# Patient Record
Sex: Male | Born: 1970
Health system: Southern US, Community
[De-identification: ages and names within clinical notes are randomized; demographics above are authoritative.]

## PROBLEM LIST (undated history)

## (undated) DIAGNOSIS — I10 Essential (primary) hypertension: Secondary | ICD-10-CM

## (undated) HISTORY — DX: Essential (primary) hypertension: I10

---

## 2006-12-21 ENCOUNTER — Ambulatory Visit: Payer: Self-pay | Admitting: Family Medicine

## 2007-07-12 ENCOUNTER — Ambulatory Visit: Payer: Self-pay | Admitting: Family Medicine

## 2007-07-12 DIAGNOSIS — I10 Essential (primary) hypertension: Secondary | ICD-10-CM | POA: Insufficient documentation

## 2008-03-27 ENCOUNTER — Ambulatory Visit: Payer: Self-pay | Admitting: Family Medicine

## 2008-11-24 ENCOUNTER — Telehealth: Payer: Self-pay | Admitting: Family Medicine

## 2008-12-12 ENCOUNTER — Ambulatory Visit: Payer: Self-pay | Admitting: Family Medicine

## 2009-01-13 ENCOUNTER — Encounter: Payer: Self-pay | Admitting: Family Medicine

## 2009-01-13 LAB — CONVERTED CEMR LAB
ALT: 15 units/L (ref 0–53)
CO2: 21 meq/L (ref 19–32)
Calcium: 9.4 mg/dL (ref 8.4–10.5)
Chloride: 108 meq/L (ref 96–112)
Cholesterol: 177 mg/dL (ref 0–200)
Potassium: 4.3 meq/L (ref 3.5–5.3)
Sodium: 143 meq/L (ref 135–145)
Total Protein: 7.2 g/dL (ref 6.0–8.3)
VLDL: 26 mg/dL (ref 0–40)

## 2009-02-06 ENCOUNTER — Ambulatory Visit: Payer: Self-pay | Admitting: Family Medicine

## 2009-06-11 ENCOUNTER — Ambulatory Visit: Payer: Self-pay | Admitting: Family Medicine

## 2009-12-11 ENCOUNTER — Ambulatory Visit: Payer: Self-pay | Admitting: Family Medicine

## 2010-05-27 ENCOUNTER — Ambulatory Visit: Payer: Self-pay | Admitting: Family Medicine

## 2010-07-27 ENCOUNTER — Ambulatory Visit: Payer: Self-pay | Admitting: Family Medicine

## 2010-07-27 DIAGNOSIS — E669 Obesity, unspecified: Secondary | ICD-10-CM

## 2010-09-20 ENCOUNTER — Ambulatory Visit: Payer: Self-pay | Admitting: Family Medicine

## 2010-09-22 ENCOUNTER — Telehealth: Payer: Self-pay | Admitting: Family Medicine

## 2010-10-26 ENCOUNTER — Ambulatory Visit: Payer: Self-pay | Admitting: Family Medicine

## 2010-12-14 NOTE — Letter (Signed)
Summary: Out of Work  [facility]  [address], [address]   [place], [place] [address]   Phone: [phone]  Fax: [phone]    September 20, 2010   Employee:  [employer] [facility]    To Whom It May Concern:   For Medical reasons, please excuse the above named employee from work for the following dates:  Start:   Nov 3rd, 4th, 7th  End:   Nov 8th  If you need additional information, please feel free to contact our office.         Sincerely,    Seymour Bars DO

## 2010-12-14 NOTE — Assessment & Plan Note (Signed)
Summary: poison ivy/ cellutlis   Vital Signs:  Patient profile:   40 year old male Height:      66.25 inches Weight:      198 pounds BMI:     31.83 O2 Sat:      100 % on Room air Pulse rate:   86 / minute BP sitting:   128 / 81  (left arm) Cuff size:   large  Vitals Entered By: Payton Spark CMA (May 27, 2010 11:33 AM)  O2 Flow:  Room air CC: Poison ivy x 5 days.    Primary Care Provider:  Seymour Bars DO  CC:  Poison ivy x 5 days. Marland Kitchen  History of Present Illness: 40 yo WM presents for poison ivy that started 5 days ago.  He has had pruritis and has been taking Benadryl..  2 days ago, he noticed an oozing from his RLE with increased redness, tenderness and swelling.  Denies any fevers or chills.  Rash has involved his abdomen, arms and legs only.    Current Medications (verified): 1)  Lisinopril 20 Mg Tabs (Lisinopril) .Marland Kitchen.. 1 Tab By Mouth Daily  Allergies (verified): No Known Drug Allergies  Past History:  Past Medical History: Reviewed history from 12/12/2008 and no changes required. HTN  Social History: Reviewed history from 12/11/2009 and no changes required. Works for [employer].    Married to Maralyn Sago and has 3 yo son. Quit smoking 10-09 after 36 pack year history.   Occas. ETOH.  Lifting weights regularly  Review of Systems      See HPI  Physical Exam  General:  alert, well-developed, well-nourished, and well-hydrated.   Head:  normocephalic, atraumatic, and male-pattern balding.   Eyes:  conjunctiva clear Mouth:  pharynx pink and moist.   Neck:  no masses.   Lungs:  Normal respiratory effort, chest expands symmetrically. Lungs are clear to auscultation, no crackles or wheezes. Heart:  Normal rate and regular rhythm. S1 and S2 normal without gallop, murmur, click, rub or other extra sounds. Extremities:  1-2+ pitting LE edema bilat Neurologic:  gait normal.   Skin:  linear erythematous rash on the arms and legs with confluent redness, heat, edema and  crusting yellow areas on the RLE.   Cervical Nodes:  No lymphadenopathy noted Psych:  good eye contact, not anxious appearing, and not depressed appearing.     Impression & Recommendations:  Problem # 1:  CELLULITIS AND ABSCESS OF LEG EXCEPT FOOT (ICD-682.6) Leg cellullitis, secondary to scratching from initial contact dermatitis 5 days ago.  Will treat with Augmentin 2  a day for 7 days.  Wash with soap and water daily, elevate legs for swelling.  Call if redness/ swelling/ pain not improving in 72 hrs or if fever starts in the next 2-3 days. His updated medication list for this problem includes:    Amoxicillin-pot Clavulanate 875-125 Mg Tabs (Amoxicillin-pot clavulanate) .Marland Kitchen... 1 tab by mouth two times a day with food x 7 days  Problem # 2:  RHUS DERMATITIS (ICD-692.6) Fairly widespread contact dermatitis x 5 days with secondary infection.  Treat with Prednisone x 6 days.  Lukewarm oatmeal baths and calamine lotion can also be used. His updated medication list for this problem includes:    Prednisone (pak) 10 Mg Tabs (Prednisone) .Marland Kitchen... Take x 6 days as directed  Complete Medication List: 1)  Lisinopril 20 Mg Tabs (Lisinopril) .Marland Kitchen.. 1 tab by mouth daily 2)  Amoxicillin-pot Clavulanate 875-125 Mg Tabs (Amoxicillin-pot clavulanate) .Marland KitchenMarland KitchenMarland Kitchen 1  tab by mouth two times a day with food x 7 days 3)  Prednisone (pak) 10 Mg Tabs (Prednisone) .... Take x 6 days as directed  Patient Instructions: 1)  For contact dermatitis with secondary Cellulitis: 2)  Treat with Prednisone Dose Pack x 6 days +  3)  Augmentin 2 x a day with food x 7 days. 4)  Oatmeal baths with cooler water, calamine cream, rest, leg elevation. 5)  Call if not seeing improvements by early next wk. Prescriptions: PREDNISONE (PAK) 10 MG TABS (PREDNISONE) Take x 6 days as directed  #1 pack x 0   Entered and Authorized by:   Seymour Bars DO   Signed by:   Seymour Bars DO on 05/27/2010   Method used:   Electronically to        [facility] [phone]* (retail)       [address] [place], [place]  [address]       Ph: [phone]       Fax: [phone]   RxID:   [id number] AMOXICILLIN-POT CLAVULANATE 875-125 MG TABS (AMOXICILLIN-POT CLAVULANATE) 1 tab by mouth two times a day with food x 7 days  #14 x 0   Entered and Authorized by:   Seymour Bars DO   Signed by:   Seymour Bars DO on 05/27/2010   Method used:   Electronically to        [facility] [phone]* (retail)       [address] [place], [place]  [address]       Ph: [phone]       Fax: [phone]   RxID:   [id number]

## 2010-12-14 NOTE — Letter (Signed)
Summary: Out of Work  [facility]  [address]   [place], [place] [address]   Phone: [phone]  Fax: [phone]    May 27, 2010   Employee:  SHREY [employer] [facility]    To Whom It May Concern:   For Medical reasons, please excuse the above named employee from work for the following dates:  Start:   July 14th - 15th  End:   July 16th  If you need additional information, please feel free to contact our office.         Sincerely,    Seymour Bars DO

## 2010-12-14 NOTE — Assessment & Plan Note (Signed)
Summary: f/u BP   Vital Signs:  Patient profile:   40 year old male Height:      66.25 inches Weight:      215 pounds BMI:     34.56 O2 Sat:      97 % on Room air Pulse rate:   87 / minute BP sitting:   142 / 79  (left arm) Cuff size:   large  Vitals Entered By: Payton Spark CMA (July 27, 2010 3:33 PM)  O2 Flow:  Room air CC: F/U HTN    Primary Care Provider:  Seymour Bars DO  CC:  F/U HTN .  History of Present Illness: Russell Orr is a 40 year-old with a history HTN.  Today he states that is doing well.  He does report difficulty with his diet and that he has been eating poorly.  He consumes yougurt for breakfast on most days.  For lunch, he says he will eat "whatever" which ranges from salads at [facility] to burgers at [employer] [facility] to sandwiches from truck stops.  For dinner he typically eats out or has frozen dinners.  He reports that he has gained back most of the weight he has lost over the past year and that he feels heavier.  He claims that his family is too busy to cook and prepare meals at home.  He also reports that he is not exercising at all at home but does get a lot activity from his job.  He reports recent stressors of not being happy with his current line of employment and that is causing a lot of stress for him.    He denies any chest pain, palpitations, shortness of breath, chnages in his vision or leg edema  Current Medications (verified): 1)  Lisinopril 20 Mg Tabs (Lisinopril) .Marland Kitchen.. 1 Tab By Mouth Daily  Allergies (verified): No Known Drug Allergies  Past History:  Past Medical History: Reviewed history from 12/12/2008 and no changes required. HTN  Social History: Works for [employer].    Married to Maralyn Sago and has 2 young kids.   Quit smoking 10-09 after 36 pack year history.   Occas. ETOH.  Lifting weights regularly  Review of Systems      See HPI  Physical Exam  General:  alert and well-developed.  overwt Eyes:  PEERLA, sclera are white  and conjunctivae are clear.  Normal cup to disk ratio. Mouth:  pharynx pink and moist.   Neck:  supple and no thyromegaly.   Lungs:  Clear to auscultation bilaterally with no crackles, wheezes, rales or rhonchi Heart:  normal rate, regular rhythm, no murmur, no gallop, and no rub.   Pulses:  Pulses are 2+ bilaterally.  No carotid bruits. Extremities:  No clubbing, cyanosis, edema. Skin:  color normal.     Impression & Recommendations:  Problem # 1:  HYPERTENSION, BENIGN ESSENTIAL (ICD-401.1) BP is up likely due to wt gain.  Will change Lisinopril to Lisinopril - HCTZ daily.  Update fasting labs.  RTC in 3 mos.   His updated medication list for this problem includes:    Lisinopril-hydrochlorothiazide 20-12.5 Mg Tabs (Lisinopril-hydrochlorothiazide) .Marland Kitchen... 1 tab by mouth qam  Orders: T-Comprehensive Metabolic Panel ([id number])  BP today: 142/79 Prior BP: 128/81 (05/27/2010)  Prior 10 Yr Risk Heart Disease: Not enough information (07/12/2007)  Labs Reviewed: K+: 4.3 (01/13/2009) Creat: : 1.00 (01/13/2009)   Chol: 177 (01/13/2009)   HDL: 35 (01/13/2009)   LDL: 116 (01/13/2009)   TG: 130 (01/13/2009)  Problem #  2:  OBESITY, UNSPECIFIED (ICD-278.00) Assessment: New BMI 34 c/w class I obesity.  Has gained 17 lbs from last visit due to poor eating habits and lack of exercise. Discussed important lifestyle changes. Pt is interested in getting back on track. Will RTC in 3 mos for f/u.  If wt comes down, should be able to cut back on BP meds.  Complete Medication List: 1)  Lisinopril-hydrochlorothiazide 20-12.5 Mg Tabs (Lisinopril-hydrochlorothiazide) .Marland Kitchen.. 1 tab by mouth qam  Other Orders: T-Lipid Profile ([id number])  Patient Instructions: 1)  Change Lisinopril to Lisinopril/ HCTZ daily for HTN. 2)  Work on [employer] and regular exercise. 3)  Call if any problems. 4)  Update fasting labs.  5)  will call you w/ results. 6)  Return to f/u BP / WT in 3  mos. Prescriptions: LISINOPRIL-HYDROCHLOROTHIAZIDE 20-12.5 MG TABS (LISINOPRIL-HYDROCHLOROTHIAZIDE) 1 tab by mouth qAM  #30 x 5   Entered and Authorized by:   Russell Bars DO   Signed by:   Russell Bars DO on 07/27/2010   Method used:   Electronically to        [facility] [phone]* (retail)       [address] [place], [place]  [address]       Ph: [phone]       Fax: [phone]   RxID:   [id number]

## 2010-12-14 NOTE — Assessment & Plan Note (Signed)
Summary: URI   Vital Signs:  Patient profile:   40 year old male Height:      66.25 inches Weight:      217 pounds BMI:     34.89 O2 Sat:      99 % on Room air Temp:     98.4 degrees F oral Pulse rate:   73 / minute BP sitting:   131 / 85  (left arm) Cuff size:   large  Vitals Entered By: Payton Spark CMA (September 20, 2010 9:47 AM)  O2 Flow:  Room air CC: ST, HA and congestion x 6 days.    Primary Care Provider:  Seymour Bars DO  CC:  ST and HA and congestion x 6 days. Marland Kitchen  History of Present Illness: 40 yo WM presents for sore throat x 5-6 days, HA, head and chest congestion, weakness.  He has fatigue but no bodyaches.  No fevers or chills.  No rash.  No anorexia, N/V/D.  He is taking Nyquil and Dayquil.  His cough is mostly dry.  He has some chest tightness.  No SOB.  He has yellow sputum production.  His kids have colds.    Current Medications (verified): 1)  Lisinopril-Hydrochlorothiazide 20-12.5 Mg Tabs (Lisinopril-Hydrochlorothiazide) .Marland Kitchen.. 1 Tab By Mouth Qam  Allergies (verified): No Known Drug Allergies  Past History:  Past Medical History: Reviewed history from 12/12/2008 and no changes required. HTN  Social History: Reviewed history from 07/27/2010 and no changes required. Works for [employer].    Married to Maralyn Sago and has 2 young kids.   Quit smoking 10-09 after 36 pack year history.   Occas. ETOH.  Lifting weights regularly  Review of Systems      See HPI  Physical Exam  General:  alert, well-developed, well-nourished, and well-hydrated.   Head:  normocephalic, atraumatic, and male-pattern balding.  sinuses NTTP Eyes:  conjunctiva clear Ears:  EACs patent; TMs translucent and gray with good cone of light and bony landmarks.  Nose:  thick yellow rhinorrhea with boggy turbinates Mouth:  o/p injected with 1+ tonsilar hypertrophy. no exudates or vesicles Neck:  no masses.   Lungs:  Normal respiratory effort, chest expands symmetrically. Lungs are  clear to auscultation, no crackles or wheezes. Heart:  Normal rate and regular rhythm. S1 and S2 normal without gallop, murmur, click, rub or other extra sounds. Skin:  color normal and no rashes.   Cervical Nodes:  shotty L>R submandibular LA   Impression & Recommendations:  Problem # 1:  VIRAL URI (ICD-465.9)  Day 5-6 of Viral URI with neg rapid strep. Supportive care with Duke's MMW, clear fluids, Advil Cold and Sinus. Call if not starting to improve by end of the week.  Orders: Rapid Strep ([address])  Complete Medication List: 1)  Lisinopril-hydrochlorothiazide 20-12.5 Mg Tabs (Lisinopril-hydrochlorothiazide) .Marland Kitchen.. 1 tab by mouth qam 2)  Dukes Magic Mouthwash  .Marland Kitchen.. 10 ml swish and gargle 4 x a day as needed for sore throat pain  Patient Instructions: 1)  Rapid Strep Negative. 2)  Will treat Viral upper respiratory infection with Magic Mouthwash, Advil Cold and Sinus, rest, clear fluids. 3)  Call if not starting to feel better by Thursday afternoon. Prescriptions: DUKES MAGIC MOUTHWASH 10 ml swish and gargle 4 x a day as needed for sore throat pain  #150 ml x 0   Entered and Authorized by:   Seymour Bars DO   Signed by:   Seymour Bars DO on 09/20/2010   Method  used:   Printed then faxed to ...       [facility] [phone]* (retail)       [address] [place], [place]  [address]       Ph: [phone]       Fax: [phone]   RxID:   [id number]    Orders Added: 1)  Rapid Strep [[address]] 2)  Est. Patient Level III [[address]]

## 2010-12-14 NOTE — Progress Notes (Signed)
Summary: Not feeling any better  Phone Note Call from Patient Call back at Home Phone [phone]   Caller: Patient Call For: Seymour Bars DO Summary of Call: Pt calls and states not feeling any better than what he did on Monday and you told him to call back if not and you would call in med for him. Uses [facility] [address] [facility] [place] Initial call taken by: Kathlene November LPN,  September 22, 2010 3:58 PM    New/Updated Medications: AMOXICILLIN 500 MG CAPS (AMOXICILLIN) 1 capsule by mouth three times a day x 7 days Prescriptions: AMOXICILLIN 500 MG CAPS (AMOXICILLIN) 1 capsule by mouth three times a day x 7 days  #21 x 0   Entered and Authorized by:   Seymour Bars DO   Signed by:   Seymour Bars DO on 09/22/2010   Method used:   Electronically to        [facility] [phone]* (retail)       [address] [place], [place]  [address]       Ph: [phone]       Fax: [phone]   RxID:   [id number]

## 2010-12-14 NOTE — Assessment & Plan Note (Signed)
Summary: f/u HTN   Vital Signs:  Patient profile:   40 year old male Height:      66.25 inches Weight:      180 pounds BMI:     28.94 O2 Sat:      100 % on Room air Temp:     98.3 degrees F oral Pulse rate:   91 / minute BP sitting:   140 / 79  (left arm) Cuff size:   large  Vitals Entered By: Payton Spark CMA (December 11, 2009 3:43 PM)  O2 Flow:  Room air CC: F/U BP   Primary Care Provider:  Seymour Bars DO  CC:  F/U BP.  History of Present Illness: 40 yo WM presents for f/u HTN.  He just started exercising again.  He ate [place] food last night.  He has had a HA.  No CP or DOE.  No leg swelling.  No problems with the Lisinopril.  His labs are UTD.    Allergies: No Known Drug Allergies  Past History:  Past Medical History: Reviewed history from 12/12/2008 and no changes required. HTN  Social History: Works for [employer].    Married to Maralyn Sago and has 52 yo son. Quit smoking 10-09 after 36 pack year history.   Occas. ETOH.  Lifting weights regularly  Review of Systems      See HPI  Physical Exam  General:  alert, well-developed, well-nourished, and well-hydrated.   Head:  normocephalic and atraumatic.   Eyes:  pupils equal, pupils round, and pupils reactive to light.   Mouth:  pharynx pink and moist.   Neck:  no masses.   Lungs:  Normal respiratory effort, chest expands symmetrically. Lungs are clear to auscultation, no crackles or wheezes. Heart:  Normal rate and regular rhythm. S1 and S2 normal without gallop, murmur, click, rub or other extra sounds. Extremities:  no E/C/C Skin:  color normal.   Psych:  good eye contact, not anxious appearing, and not depressed appearing.     Impression & Recommendations:  Problem # 1:  HYPERTENSION, BENIGN ESSENTIAL (ICD-401.1) BP higher.  I will increase his Lisinopril from 10 mg to 20 mg once daily. He is working on diet, exercise and wt loss.  Labs due in 2 mos. His updated medication list for this problem  includes:    Lisinopril 20 Mg Tabs (Lisinopril) .Marland Kitchen... 1 tab by mouth daily  BP today: 140/79 Prior BP: 130/77 (06/11/2009)  Prior 10 Yr Risk Heart Disease: Not enough information (07/12/2007)  Labs Reviewed: K+: 4.3 (01/13/2009) Creat: : 1.00 (01/13/2009)   Chol: 177 (01/13/2009)   HDL: 35 (01/13/2009)   LDL: 116 (01/13/2009)   TG: 130 (01/13/2009)  Complete Medication List: 1)  Lisinopril 20 Mg Tabs (Lisinopril) .Marland Kitchen.. 1 tab by mouth daily Prescriptions: LISINOPRIL 20 MG TABS (LISINOPRIL) 1 tab by mouth daily  #90 x 0   Entered and Authorized by:   Seymour Bars DO   Signed by:   Seymour Bars DO on 12/11/2009   Method used:   Printed then faxed to ...       [facility] [phone]* (retail)       [address] [place], [place]  [address]       Ph: [phone]       Fax: [phone]   RxID:   [id number]

## 2011-02-14 ENCOUNTER — Telehealth: Payer: Self-pay | Admitting: *Deleted

## 2011-02-14 NOTE — Telephone Encounter (Signed)
Wife called stating Pt has poison ivy again and is requesting Rx be sent in since you do not have any apts available this week. Please advise.

## 2011-02-14 NOTE — Telephone Encounter (Signed)
Pls find out where his rash is.  If small, can use OTC Hydrocortisone cream 3 x a day and oral Benadryl for itching.  Let me know.

## 2011-02-14 NOTE — Telephone Encounter (Signed)
LMOM informing wife of the above and for her to [facility]

## 2011-05-20 ENCOUNTER — Other Ambulatory Visit: Payer: Self-pay | Admitting: Family Medicine

## 2011-05-20 MED ORDER — LISINOPRIL-HYDROCHLOROTHIAZIDE 20-12.5 MG PO TABS: 1.0000 | ORAL_TABLET | Freq: Every day | ORAL | Status: DC

## 2011-07-21 ENCOUNTER — Encounter: Payer: Self-pay | Admitting: Family Medicine

## 2011-07-21 ENCOUNTER — Inpatient Hospital Stay (INDEPENDENT_AMBULATORY_CARE_PROVIDER_SITE_OTHER)
Admission: RE | Admit: 2011-07-21 | Discharge: 2011-07-21 | Disposition: A | Discharge status: Home / Self Care | Payer: BC Managed Care – PPO | Source: Ambulatory Visit | Attending: Family Medicine | Admitting: Family Medicine

## 2011-07-21 DIAGNOSIS — J029 Acute pharyngitis, unspecified: Secondary | ICD-10-CM

## 2011-07-21 DIAGNOSIS — J069 Acute upper respiratory infection, unspecified: Secondary | ICD-10-CM

## 2011-09-05 ENCOUNTER — Other Ambulatory Visit: Payer: Self-pay | Admitting: Family Medicine

## 2011-09-05 NOTE — Telephone Encounter (Signed)
Pt's wife called for refill of her husband med (lisinopril).   Plan:  Pt overdue for his appt.  Appt scheduled for 09-20-11 with Dr. Thurmond Butts.  Wife aware. Jarvis Newcomer, LPN Domingo Dimes

## 2011-09-15 ENCOUNTER — Encounter: Payer: Self-pay | Admitting: Family Medicine

## 2011-09-20 ENCOUNTER — Ambulatory Visit (INDEPENDENT_AMBULATORY_CARE_PROVIDER_SITE_OTHER): Payer: BC Managed Care – PPO | Admitting: Family Medicine

## 2011-09-20 ENCOUNTER — Encounter: Payer: Self-pay | Admitting: Family Medicine

## 2011-09-20 VITALS — BP 132/89 | HR 87 | Ht 67.0 in | Wt 239.0 lb

## 2011-09-20 DIAGNOSIS — Z23 Encounter for immunization: Secondary | ICD-10-CM

## 2011-09-20 DIAGNOSIS — I10 Essential (primary) hypertension: Secondary | ICD-10-CM

## 2011-09-20 MED ORDER — LISINOPRIL-HYDROCHLOROTHIAZIDE 20-25 MG PO TABS: 1.0000 | ORAL_TABLET | Freq: Every day | ORAL | Status: DC

## 2011-09-20 NOTE — Progress Notes (Signed)
  Subjective:    Patient ID: Russell Orr, male    DOB: 03-27-1971, 40 y.o.   MRN: [id number]  Hypertension This is a chronic problem. The current episode started more than 1 year ago. The problem has been gradually improving since onset. The problem is controlled. Associated symptoms include shortness of breath. Pertinent negatives include no anxiety, chest pain, malaise/fatigue or palpitations. Risk factors for coronary artery disease include male gender, obesity, sedentary lifestyle, family history and diabetes mellitus. Past treatments include angiotensin blockers and diuretics. Compliance problems include exercise and diet.  There is no history of angina, kidney disease, heart failure or renovascular disease. There is no history of chronic renal disease.      Review of Systems  Constitutional: Negative for malaise/fatigue.  Respiratory: Positive for shortness of breath.   Cardiovascular: Negative for chest pain and palpitations.  All other systems reviewed and are negative.   BP 132/89  Pulse 87  Ht 5\' 7"  (1.702 m)  Wt 239 lb (108.41 kg)  BMI 37.43 kg/m2  SpO2 96%     Objective:   Physical Exam  Vitals reviewed. Constitutional: He is oriented to person, place, and time. He appears well-developed and well-nourished.       See also form, to be scanned into chart.  HENT:  Head: Normocephalic.  Neck: Normal range of motion. Neck supple.  Cardiovascular: Normal rate, regular rhythm and normal heart sounds.  Exam reveals no gallop and no friction rub.   No murmur heard. Pulmonary/Chest: Effort normal. No respiratory distress. He has no wheezes.  Genitourinary: Testes normal.  Musculoskeletal:             Neurological: He is alert and oriented to person, place, and time.  Skin: Skin is warm and dry.       wnl  Psychiatric: He has a normal mood and affect.          Assessment & Plan:  Discussed about need to exercise. Expressed concern about weight gain Return  for PE in 3-4 months

## 2011-09-20 NOTE — Patient Instructions (Signed)

## 2011-10-17 NOTE — Progress Notes (Signed)
Summary: sore throat/sinus/coughing rm 2   Vital Signs:  Patient Profile:   40 Years Old Male CC:      sore throat, sinus problems x 6 days Height:     66.25 inches Weight:      227.50 pounds O2 Sat:      97 % O2 treatment:    Room Air Temp:     99.4 degrees F oral Pulse rate:   83 / minute Resp:     18 per minute BP sitting:   121 / 80  (left arm) Cuff size:   regular  Vitals Entered By: Clemens Catholic LPN (July 21, 2011 9:57 AM)                  Updated Prior Medication List: LISINOPRIL-HYDROCHLOROTHIAZIDE 20-12.5 MG TABS (LISINOPRIL-HYDROCHLOROTHIAZIDE) 1 tab by mouth qAM  Current Allergies (reviewed today): No known allergies History of Present Illness Chief Complaint: sore throat, sinus problems x 6 days History of Present Illness:  Subjective: Patient complains of URI symptoms that started 6 days ago with a sore throat, now persistent. + productive cough for four days No pleuritic pain No wheezing + nasal congestion + post-nasal drainage ? sinus pain/pressure + drainage from right eye today + bilateral earache No hemoptysis No SOB No fever/chills No nausea No vomiting No abdominal pain No diarrhea No skin rashes + fatigue No myalgias No headache Used OTC meds without relief   REVIEW OF SYSTEMS Constitutional Symptoms       Complains of fatigue.     Denies fever, chills, night sweats, weight loss, and weight gain.  Eyes       Complains of eye drainage.      Denies change in vision, eye pain, glasses, contact lenses, and eye surgery. Ear/Nose/Throat/Mouth       Complains of ear pain, sinus problems, sore throat, and hoarseness.      Denies hearing loss/aids, change in hearing, ear discharge, dizziness, frequent runny nose, frequent nose bleeds, and tooth pain or bleeding.  Respiratory       Complains of productive cough.      Denies dry cough, wheezing, shortness of breath, asthma, bronchitis, and emphysema/COPD.  Cardiovascular  Complains of tires easily with exhertion.      Denies murmurs and chest pain.    Gastrointestinal       Denies stomach pain, nausea/vomiting, diarrhea, constipation, blood in bowel movements, and indigestion. Genitourniary       Denies painful urination, kidney stones, and loss of urinary control. Neurological       Complains of weakness.      Denies headaches, loss of or changes in sensation, numbness, tngling, tremors, paralysis, seizures, and fainting/blackouts. Musculoskeletal       Denies muscle pain, joint pain, joint stiffness, decreased range of motion, redness, swelling, muscle weakness, and gout.  Skin       Denies bruising, unusual mles/lumps or sores, and hair/skin or nail changes.  Psych       Denies mood changes, temper/anger issues, anxiety/stress, speech problems, depression, and sleep problems. Other Comments: pt c/o sore throat, productive cough, sinus drainage and  bil ear ache off and on x 6 days. he has taken dayquil.   Past History:  Past Medical History: Reviewed history from 12/12/2008 and no changes required. HTN  Past Surgical History: Reviewed history from 06/11/2009 and no changes required. none  Family History: Reviewed history from 12/21/2006 and no changes required. father AMI at 5, died.  Had HTN  and high chol mother died at 69 with ESRD, bipolar d/o brother obese  Social History: Reviewed history from 07/27/2010 and no changes required. Works for [employer].    Married to Maralyn Sago and has 2 young kids.   Quit smoking 10-09 after 36 pack year history.   Occas. ETOH.  Lifting weights regularly   Objective:  No acute distress  Eyes:  Pupils are equal, round, and reactive to light and accomodation.  Extraocular movement is intact.  Left conjuctivae normal; right conjuctivae slightly pink Ears:  Canals normal.   Tight tympanic membrane has serous effsion and slightly pink.  Left tympanic membrane normal Nose:  Congested turbinates worse on  right.  + right maxillary sinus tenderness   Pharynx:  Normal  Neck:  Supple.  No adenopathy is present.  No thyromegaly is present  Lungs:  Clear to auscultation.  Breath sounds are equal.  Heart:  Regular rate and rhythm without murmurs, rubs, or gallops.  Abdomen:  Nontender without masses or hepatosplenomegaly.  Bowel sounds are present.  No CVA or flank tenderness.  Rapid strep test negative   Assessment New Problems: ACUTE PHARYNGITIS (ICD-462) ACUTE MAXILLARY SINUSITIS (ICD-461.0) UPPER RESPIRATORY INFECTION (ICD-465.9)  VIRAL URI WITH RIGHT SINUSITIS AND RIGHT SEROUS OTITIS  Plan New Medications/Changes: BENZONATATE 200 MG CAPS (BENZONATATE) One by mouth hs as needed cough  #12 x 0, 07/21/2011, Donna Christen MD AMOXICILLIN 875 MG TABS (AMOXICILLIN) One by mouth two times a day  #20 x 0, 07/21/2011, Donna Christen MD  New Orders: Pulse Oximetry (single measurment) [94760] New Patient Level III [99203] Rapid Strep [45409] Planning Comments:   Begin amoxicillin, expectorant, topical decongestant, cough suppressant at bedtime.  Increase fluid intake Ibuprofen for sore throat Followup with PCP if not improving 7 to 10 days   The patient and/or caregiver has been counseled thoroughly with regard to medications prescribed including dosage, schedule, interactions, rationale for use, and possible side effects and they verbalize understanding.  Diagnoses and expected course of recovery discussed and will return if not improved as expected or if the condition worsens. Patient and/or caregiver verbalized understanding.  Prescriptions: BENZONATATE 200 MG CAPS (BENZONATATE) One by mouth hs as needed cough  #12 x 0   Entered and Authorized by:   Donna Christen MD   Signed by:   Donna Christen MD on 07/21/2011   Method used:   Print then Give to Patient   RxID:   [id number] AMOXICILLIN 875 MG TABS (AMOXICILLIN) One by mouth two times a day  #20 x 0   Entered and Authorized by:    Donna Christen MD   Signed by:   Donna Christen MD on 07/21/2011   Method used:   Print then Give to Patient   RxID:   [id number]   Patient Instructions: 1)  Take Mucinex  (guaifenesin) twice daily for congestion. 2)  Increase fluid intake, rest. 3)  May use Afrin nasal spray (or generic oxymetazoline) twice daily for about 5 days.  Also recommend using saline nasal spray several times daily and/or saline nasal irrigation 4)  May take Ibuprofen 200mg , 4 tabs every 8 hours with food for sore throat. 5)  Followup with family doctor if not improving 7 to 10 days.   Orders Added: 1)  Pulse Oximetry (single measurment) [94760] 2)  New Patient Level III [32440] 3)  Rapid Strep [10272]    Laboratory Results  Date/Time Received: July 21, 2011 10:00 AM  Date/Time Reported: July 21, 2011  10:00 AM   Other Tests  Rapid Strep: negative  Kit Test Internal QC: Negative   (Normal Range: Negative)

## 2011-12-20 ENCOUNTER — Encounter: Payer: BC Managed Care – PPO | Admitting: Family Medicine

## 2012-01-12 ENCOUNTER — Encounter: Payer: Self-pay | Admitting: *Deleted

## 2012-01-17 ENCOUNTER — Encounter: Payer: BC Managed Care – PPO | Admitting: Family Medicine

## 2012-11-22 ENCOUNTER — Ambulatory Visit (INDEPENDENT_AMBULATORY_CARE_PROVIDER_SITE_OTHER): Payer: BC Managed Care – PPO | Admitting: Family Medicine

## 2012-11-22 ENCOUNTER — Encounter: Payer: Self-pay | Admitting: Family Medicine

## 2012-11-22 VITALS — BP 127/80 | HR 65 | Ht 67.0 in | Wt 199.0 lb

## 2012-11-22 DIAGNOSIS — I10 Essential (primary) hypertension: Secondary | ICD-10-CM

## 2012-11-22 DIAGNOSIS — Z0000 Encounter for general adult medical examination without abnormal findings: Secondary | ICD-10-CM

## 2012-11-22 DIAGNOSIS — E786 Lipoprotein deficiency: Secondary | ICD-10-CM

## 2012-11-22 DIAGNOSIS — E669 Obesity, unspecified: Secondary | ICD-10-CM

## 2012-11-22 LAB — LIPID PANEL
Cholesterol: 211 mg/dL — ABNORMAL HIGH (ref 0–200)
Total CHOL/HDL Ratio: 4.5 Ratio
Triglycerides: 79 mg/dL (ref <150)
VLDL: 16 mg/dL (ref 0–40)

## 2012-11-22 LAB — BASIC METABOLIC PANEL WITH GFR
CO2: 28 mEq/L (ref 19–32)
Calcium: 10.5 mg/dL (ref 8.4–10.5)
Creat: 0.92 mg/dL (ref 0.50–1.35)
GFR, Est African American: >89 mL/min
Glucose, Bld: 91 mg/dL (ref 70–99)

## 2012-11-22 MED ORDER — LISINOPRIL-HYDROCHLOROTHIAZIDE 20-12.5 MG PO TABS: 1.0000 | ORAL_TABLET | Freq: Every day | ORAL | Status: DC

## 2012-11-22 NOTE — Progress Notes (Signed)
CC: Russell Orr is a 42 y.o. male is here for Annual Exam and Hypertension  Colonoscopy: Not indicated Prostate: Not indicated  Influenza Vaccine: Sept 2013 Pneumovax: Not indicated Td/Tdap: April 2103 Zoster: Not indicated   Subjective: HPI:  Over the past 2 weeks have you been bothered by: - Little interest or pleasure in doing things: No - Feeling down depressed or hopeless: No  Patient tries to watch the eats, appears to have a varied diet. No formal exercise routine but stays active on his job. Social alcohol use, has quit smoking for over 3 years, no recreational drug use.   Review Of Systems Outlined In HPI  Past Medical History  Diagnosis Date  . Hypertension      Family History  Problem Relation Age of Onset  . Heart attack Father   . Hypertension Father   . Hyperlipidemia Father   . Bipolar disorder Mother     died ESRD age 66  . Obesity Brother      History  Substance Use Topics  . Smoking status: Former Smoker    Quit date: 08/14/2008  . Smokeless tobacco: Not on file  . Alcohol Use: Yes     Comment: occasional     Objective: Filed Vitals:   11/22/12 0834  BP: 127/80  Pulse: 65    General: No Acute Distress HEENT: Atraumatic, normocephalic, conjunctivae normal without scleral icterus.  No nasal discharge, hearing grossly intact, TMs with good landmarks bilaterally with no middle ear abnormalities, posterior pharynx clear without oral lesions. Neck: Supple, trachea midline, no cervical nor supraclavicular adenopathy. Pulmonary: Clear to auscultation bilaterally without wheezing, rhonchi, nor rales. Cardiac: Regular rate and rhythm.  No murmurs, rubs, nor gallops. No peripheral edema.  2+ peripheral pulses bilaterally. Abdomen: Bowel sounds normal.  No masses.  Non-tender without rebound.  Negative Murphy's sign. GU: Penis without lesions.  Bilateral descended testicles. Pedunculated skin tag just behind the right testicle on the thigh  noninflamed MSK: Grossly intact, no signs of weakness.  Full strength throughout upper and lower extremities.  Full ROM in upper and lower extremities.  No midline spinal tenderness. Neuro: Gait unremarkable, CN II-XII grossly intact.  C5-C6 Reflex 2/4 Bilaterally, L4 Reflex 2/4 Bilaterally.  Cerebellar function intact. Skin: No rashes. Psych: Alert and oriented to person/place/time.  Thought process normal. No anxiety/depression.  Assessment & Plan: Russell Orr was seen today for annual exam and hypertension.  Diagnoses and associated orders for this visit:  Annual physical exam  Hypertension, benign essential - lisinopril-hydrochlorothiazide (PRINZIDE,ZESTORETIC) 20-12.5 MG per tablet; Take 1 tablet by mouth daily. - lisinopril-hydrochlorothiazide (ZESTORETIC) 20-12.5 MG per tablet; Take 1 tablet by mouth daily. - BASIC METABOLIC PANEL WITH GFR  Obesity, unspecified  Low hdl (under 40) - Lipid panel    Discussed health maintenance topics for general health promotion and wellness, a handout was given to emphasize these topics for the patient's reference. Counseling on benign nature of skin tags. Hypertension at goal continue current regimen above. Basic metabolic panel to ensure no renal or electrolyte dysfunction applaud his continued weight loss with dietary interventions Overdue for a lipid panel  Return in about 1 year (around 11/22/2013).

## 2012-11-22 NOTE — Patient Instructions (Addendum)
Blood Pressure Goals: Less than 140/90   Dr. Genelle Bal General Advice Following Your Complete Physical Exam  The Benefits of Regular Exercise: Unless you suffer from an uncontrolled cardiovascular condition, studies strongly suggest that regular exercise and physical activity will add to both the quality and length of your life.  The World Health Organization recommends 150 minutes of moderate intensity aerobic activity every week.  This is best split over 3-4 days a week, and can be as simple as a brisk walk for just over 35 minutes "most days of the week".  This type of exercise has been shown to lower LDL-Cholesterol, lower average blood sugars, lower blood pressure, lower cardiovascular disease risk, improve memory, and increase one's overall sense of wellbeing.  The addition of anaerobic (or "strength training") exercises offers additional benefits including but not limited to increased metabolism, prevention of osteoporosis, and improved overall cholesterol levels.  How Can I Strive For A Low-Fat Diet?: Current guidelines recommend that 25-35 percent of your daily energy (food) intake should come from fats.  One might ask how can this be achieved without having to dissect each meal on a daily basis?  Switch to skim or 1% milk instead of whole milk.  Focus on lean meats such as ground [place], fresh fish, baked chicken, and lean cuts of beef as your source of dietary protein.  Consume less than 300mg /day of dietary cholesterol.  Limit trans fatty acid consumption primarily by limiting synthetic trans fats such as partially hydrogenated oils (Ex: fried fast foods).  Focus efforts on reducing your intake of "solid" fats (Ex: Butter).  Substitute olive or vegetable oil for solid fats where possible.  Moderation of Salt Intake: Provided you don't carry a diagnosis of congestive heart failure nor renal failure, I recommend a daily allowance of no more than 2300 mg of salt (sodium).  Keeping under  this daily goal is associated with a decreased risk of cardiovascular events, creeping above it can lead to elevated blood pressures and increases your risk of cardiovascular events.  Milligrams (mg) of salt is listed on all nutrition labels, and your daily intake can add up faster than you think.  Most canned and frozen dinners can pack in over half your daily salt allowance in one meal.    Lifestyle Health Risks: Certain lifestyle choices carry specific health risks.  As you may already know, tobacco use has been associated with increasing one's risk of cardiovascular disease, pulmonary disease, numerous cancers, among many other issues.  What you may not know is that there are medications and nicotine replacement strategies that can more than double your chances of successfully quitting.  I would be thrilled to help manage your quitting strategy if you currently use tobacco products.  When it comes to alcohol use, I've yet to find an "ideal" daily allowance.  Provided an individual does not have a medical condition that is exacerbated by alcohol consumption, general guidelines determine "safe drinking" as no more than two standard drinks for a man or no more than one standard drink for a male per day.  However, much debate still exists on whether any amount of alcohol consumption is technically "safe".  My general advice, keep alcohol consumption to a minimum for general health promotion.  If you or others believe that alcohol, tobacco, or recreational drug use is interfering with your life, I would be happy to provide confidential counseling regarding treatment options.  General "Over The Counter" Nutrition Advice: Postmenopausal women should aim for a daily  calcium intake of 1200 mg, however a significant portion of this might already be provided by diets including milk, yogurt, cheese, and other dairy products.  Vitamin D has been shown to help preserve bone density, prevent fatigue, and has even been  shown to help reduce falls in the elderly.  Ensuring a daily intake of 800 Units of Vitamin D is a good place to start to enjoy the above benefits, we can easily check your Vitamin D level to see if you'd potentially benefit from supplementation beyond 800 Units a day.  Folic Acid intake should be of particular concern to women of childbearing age.  Daily consumption of 400-800 mcg of Folic Acid is recommended to minimize the chance of spinal cord defects in a fetus should pregnancy occur.    For many adults, accidents still remain one of the most common culprits when it comes to cause of death.  Some of the simplest but most effective preventitive habits you can adopt include regular seatbelt use, proper helmet use, securing firearms, and regularly testing your smoke and carbon monoxide detectors.  Russell Orr. [facility] DO Med [facility] [address], [address] [place], [place] [address] Phone: [phone]

## 2012-11-23 ENCOUNTER — Encounter: Payer: Self-pay | Admitting: Family Medicine

## 2012-11-23 DIAGNOSIS — E785 Hyperlipidemia, unspecified: Secondary | ICD-10-CM | POA: Insufficient documentation

## 2012-12-29 ENCOUNTER — Other Ambulatory Visit: Payer: Self-pay

## 2013-09-19 ENCOUNTER — Other Ambulatory Visit: Payer: Self-pay

## 2013-12-27 ENCOUNTER — Other Ambulatory Visit: Payer: Self-pay | Admitting: Family Medicine

## 2014-10-22 ENCOUNTER — Other Ambulatory Visit: Payer: Self-pay

## 2014-10-22 MED ORDER — LISINOPRIL-HYDROCHLOROTHIAZIDE 20-12.5 MG PO TABS: 1.0000 | ORAL_TABLET | Freq: Every day | ORAL | Status: DC

## 2014-10-27 ENCOUNTER — Encounter: Payer: Self-pay | Admitting: Family Medicine

## 2014-10-27 ENCOUNTER — Ambulatory Visit (INDEPENDENT_AMBULATORY_CARE_PROVIDER_SITE_OTHER): Payer: BC Managed Care – PPO | Admitting: Family Medicine

## 2014-10-27 VITALS — BP 147/94 | HR 89 | Wt 232.0 lb

## 2014-10-27 DIAGNOSIS — I10 Essential (primary) hypertension: Secondary | ICD-10-CM

## 2014-10-27 DIAGNOSIS — Z789 Other specified health status: Secondary | ICD-10-CM | POA: Diagnosis not present

## 2014-10-27 DIAGNOSIS — F1090 Alcohol use, unspecified, uncomplicated: Secondary | ICD-10-CM

## 2014-10-27 MED ORDER — LISINOPRIL-HYDROCHLOROTHIAZIDE 20-12.5 MG PO TABS: 1.0000 | ORAL_TABLET | Freq: Every day | ORAL | Status: DC

## 2014-10-27 NOTE — Progress Notes (Signed)
CC: Russell Orr is a 43 y.o. male is here for Hypertension   Subjective: HPI:  Follow-up essential hypertension: Since I saw him last he's been checking his blood pressure periodically and states that it typically below 140/90. He's been taking lisinopril-HCTZ on a daily basis without missed doses.  He denies any known side effects or intolerance. Blood pressure seems to be worse when he does not watch what he eats, for instance this weekend he tells me that he ate junk food all weekend long with a higher sodium intake that he usually consumes. He also tells me that he went on a bender this weekend with alcohol use and is unable to quantify how much he was drinking but that was heavy and more than what he usually drinks. He denies any difficulty with cutting back drinking and tells me he has no troubles with alcohol cessation when he puts his mind to it. He denies drinking on a daily basis. Denies chest pain shortness of breath orthopnea or peripheral edema nor motor or sensory disturbances.   Review Of Systems Outlined In HPI  Past Medical History  Diagnosis Date  . Hypertension     No past surgical history on file. Family History  Problem Relation Age of Onset  . Heart attack Father   . Hypertension Father   . Hyperlipidemia Father   . Bipolar disorder Mother     died ESRD age 41  . Obesity Brother     History   Social History  . Marital Status: Married    Spouse Name: N/A    Number of Children: N/A  . Years of Education: N/A   Occupational History  . Not on file.   Social History Main Topics  . Smoking status: Former Smoker    Quit date: 08/14/2008  . Smokeless tobacco: Not on file  . Alcohol Use: Yes     Comment: occasional  . Drug Use: Not on file  . Sexual Activity: Not on file     Comment: works Time [employer], married, 2 kids, lifts weights.   Other Topics Concern  . Not on file   Social History Narrative  . No narrative on file     Objective: BP  147/94 mmHg  Pulse 89  Wt 232 lb (105.235 kg)  General: Alert and Oriented, No Acute Distress HEENT: Pupils equal, round, reactive to light. Conjunctivae clear.  Moist mucous membranes Lungs: Clear to auscultation bilaterally, no wheezing/ronchi/rales.  Comfortable work of breathing. Good air movement. Cardiac: Regular rate and rhythm. Normal S1/S2.  No murmurs, rubs, nor gallops.   Extremities: No peripheral edema.  Strong peripheral pulses.  Mental Status: No depression, anxiety, nor agitation. Skin: Warm and dry.  Assessment & Plan: Russell HansenGerald was seen today for hypertension.  Diagnoses and associated orders for this visit:  HYPERTENSION, BENIGN ESSENTIAL - lisinopril-hydrochlorothiazide (PRINZIDE,ZESTORETIC) 20-12.5 MG per tablet; Take 1 tablet by mouth daily. Call if BP remains above 140/90. - BASIC METABOLIC PANEL WITH GFR  Heavy alcohol use    Hypertension: Uncontrolled chronic condition joint suspicion is high that this is heavily influenced by the sodium intake he took in over the weekend. I've asked him to cut back significantly on sodium and check blood pressure on a daily basis for a week after he's made this intervention and to call me if his continued dose of lisinopril-hydrochlorothiazide does not keep blood pressure below 140/90. Encouraged him to cut back on drinking no more than 3 alcoholic beverages a day  and no more than 21 drinks in a week. I've asked if he feels that he needs help with cutting back on alcohol use and he politely says no.  Follow-up in 3 months.  Return if symptoms worsen or fail to improve.

## 2015-01-14 ENCOUNTER — Telehealth: Payer: Self-pay | Admitting: *Deleted

## 2015-01-14 ENCOUNTER — Telehealth: Payer: Self-pay | Admitting: Family Medicine

## 2015-01-14 MED ORDER — AMOXICILLIN 500 MG PO CAPS: ORAL_CAPSULE | ORAL | Status: DC

## 2015-01-14 NOTE — Telephone Encounter (Signed)
Pt.notified

## 2015-01-14 NOTE — Telephone Encounter (Signed)
Patient's wife Maralyn Sago(Sarah) called back stated that she has been calling our office since Monday about her husband needing an antibotic today because he is having dental surgery tomorrow. Pt stated she left a message on triage line and Andrea's nurse line. I adv patient that Sue Lushndrea has sent a phone note to Dr. Ivan AnchorsHommel this morning but he has not responded yet. Patients' wife num [phone]. Thanks

## 2015-01-14 NOTE — Telephone Encounter (Signed)
Rx sent to [facility]

## 2015-01-14 NOTE — Telephone Encounter (Signed)
Pt's wife called and states pt is going to have a dental procedure in a couple of days and needs an abx called into his pharm. She states he has a heart murmur

## 2015-06-15 ENCOUNTER — Other Ambulatory Visit: Payer: Self-pay | Admitting: *Deleted

## 2015-06-15 DIAGNOSIS — I10 Essential (primary) hypertension: Secondary | ICD-10-CM

## 2015-06-15 MED ORDER — LISINOPRIL-HYDROCHLOROTHIAZIDE 20-12.5 MG PO TABS: ORAL_TABLET | ORAL | Status: DC

## 2015-06-24 ENCOUNTER — Encounter: Payer: Self-pay | Admitting: Family Medicine

## 2015-06-24 ENCOUNTER — Ambulatory Visit (INDEPENDENT_AMBULATORY_CARE_PROVIDER_SITE_OTHER): Payer: BLUE CROSS/BLUE SHIELD | Admitting: Family Medicine

## 2015-06-24 VITALS — BP 127/76 | HR 82 | Ht 67.0 in | Wt 242.0 lb

## 2015-06-24 DIAGNOSIS — I10 Essential (primary) hypertension: Secondary | ICD-10-CM | POA: Diagnosis not present

## 2015-06-24 MED ORDER — LISINOPRIL-HYDROCHLOROTHIAZIDE 20-12.5 MG PO TABS: ORAL_TABLET | ORAL | Status: DC

## 2015-06-24 NOTE — Progress Notes (Signed)
CC: Russell Orr is a 44 y.o. male is here for Hypertension   Subjective: HPI:  Follow-up essential hypertension: Continues to take lisinopril-HCTZ on a daily basis. Reports urinating more frequently during the mornings but not interfering with quality of life. Denies any urinary symptoms. Denies chest pain shortness of breath orthopnea nor peripheral edema. No outside blood pressure to report. He is exercising every morning by biking 7 miles. Trying to watch what he eats as well.   Review Of Systems Outlined In HPI  Past Medical History  Diagnosis Date  . Hypertension     No past surgical history on file. Family History  Problem Relation Age of Onset  . Heart attack Father   . Hypertension Father   . Hyperlipidemia Father   . Bipolar disorder Mother     died ESRD age 3  . Obesity Brother     Social History   Social History  . Marital Status: Married    Spouse Name: N/A  . Number of Children: N/A  . Years of Education: N/A   Occupational History  . Not on file.   Social History Main Topics  . Smoking status: Former Smoker    Quit date: 08/14/2008  . Smokeless tobacco: Not on file  . Alcohol Use: Yes     Comment: occasional  . Drug Use: Not on file  . Sexual Activity: Not on file     Comment: works [employer], married, 2 kids, lifts weights.   Other Topics Concern  . Not on file   Social History Narrative     Objective: BP 127/76 mmHg  Pulse 82  Ht  (1.702 m)  Wt 242 lb (109.77 kg)  BMI 37.89 kg/m2  General: Alert and Oriented, No Acute Distress HEENT: Pupils equal, round, reactive to light. Conjunctivae clear.  Moist mucous membranes Lungs: Clear to auscultation bilaterally, no wheezing/ronchi/rales.  Comfortable work of breathing. Good air movement. Cardiac: Regular rate and rhythm. Normal S1/S2.  No murmurs, rubs, nor gallops.   Extremities: No peripheral edema.  Strong peripheral pulses.  Mental Status: No depression, anxiety, nor  agitation. Skin: Warm and dry.  Assessment & Plan: Russell Orr was seen today for hypertension.  Diagnoses and all orders for this visit:  HYPERTENSION, BENIGN ESSENTIAL -     lisinopril-hydrochlorothiazide (PRINZIDE,ZESTORETIC) 20-12.5 MG per tablet; Take one tablet by mouth once daily. Appointment needed before future refills   ESSential hypertension: Controlled continue lisinopril-HCTZ.   Return in about 6 months (around 12/25/2015).

## 2016-01-14 ENCOUNTER — Other Ambulatory Visit: Payer: Self-pay

## 2016-01-14 DIAGNOSIS — I10 Essential (primary) hypertension: Secondary | ICD-10-CM

## 2016-01-14 MED ORDER — LISINOPRIL-HYDROCHLOROTHIAZIDE 20-12.5 MG PO TABS: ORAL_TABLET | ORAL | Status: DC

## 2016-01-18 ENCOUNTER — Encounter: Payer: Self-pay | Admitting: Family Medicine

## 2016-01-18 ENCOUNTER — Ambulatory Visit (INDEPENDENT_AMBULATORY_CARE_PROVIDER_SITE_OTHER): Payer: BLUE CROSS/BLUE SHIELD | Admitting: Family Medicine

## 2016-01-18 VITALS — BP 141/95 | HR 87 | Wt 272.0 lb

## 2016-01-18 DIAGNOSIS — I10 Essential (primary) hypertension: Secondary | ICD-10-CM | POA: Diagnosis not present

## 2016-01-18 DIAGNOSIS — M25571 Pain in right ankle and joints of right foot: Secondary | ICD-10-CM | POA: Diagnosis not present

## 2016-01-18 MED ORDER — LISINOPRIL-HYDROCHLOROTHIAZIDE 20-25 MG PO TABS: 1.0000 | ORAL_TABLET | Freq: Every day | ORAL | Status: DC

## 2016-01-18 NOTE — Progress Notes (Signed)
CC: Russell Orr is a 45 y.o. male is here for Hypertension and Medication Refill   Subjective: HPI:  Follow-up essential hypertension: Taking lisinopril-hydrochlorothiazide with a heart person complaints. He reports that his blood pressure was checked at his dentist recently was in the stage I hypertension range. No chest pain shortness of breath orthopnea nor peripheral edema.  Complains of right ankle pain localized to the lateral aspect of the ankle that has been present for the past 2 months. It's only present when he has to drive long distances. It's slightly tender, mild in severity and improves with stretching. No overlying skin changes or bruising. He denies any recent or remote trauma. Pain is nonradiating. Denies joint pain elsewhere   Review Of Systems Outlined In HPI  Past Medical History  Diagnosis Date  . Hypertension     No past surgical history on file. Family History  Problem Relation Age of Onset  . Heart attack Father   . Hypertension Father   . Hyperlipidemia Father   . Bipolar disorder Mother     died ESRD age 43  . Obesity Brother     Social History   Social History  . Marital Status: Married    Spouse Name: N/A  . Number of Children: N/A  . Years of Education: N/A   Occupational History  . Not on file.   Social History Main Topics  . Smoking status: Former Smoker    Quit date: 08/14/2008  . Smokeless tobacco: Not on file  . Alcohol Use: Yes     Comment: occasional  . Drug Use: Not on file  . Sexual Activity: Not on file     Comment: works Time [employer], married, 2 kids, lifts weights.   Other Topics Concern  . Not on file   Social History Narrative     Objective: BP 141/95 mmHg  Pulse 87  Wt 272 lb (123.378 kg)  General: Alert and Oriented, No Acute Distress HEENT: Pupils equal, round, reactive to light. Conjunctivae clear.  Moist mucous membranes Lungs: Clear to auscultation bilaterally, no wheezing/ronchi/rales.   Comfortable work of breathing. Good air movement. Cardiac: Regular rate and rhythm. Normal S1/S2.  No murmurs, rubs, nor gallops.   Right ankle: Anterior drawer negative, pain is reproduced with palpation of the anterior talo fibular ligament, no palpable abnormality no pain when palpating the bony aspect of the lateral malleolus. Extremities: No peripheral edema.  Strong peripheral pulses.  Mental Status: No depression, anxiety, nor agitation. Skin: Warm and dry.  Assessment & Plan: Russell HansenGerald was seen today for hypertension and medication refill.  Diagnoses and all orders for this visit:  HYPERTENSION, BENIGN ESSENTIAL -     lisinopril-hydrochlorothiazide (PRINZIDE,ZESTORETIC) 20-25 MG tablet; Take 1 tablet by mouth daily.  Right ankle pain  Other orders -     Cancel: lisinopril-hydrochlorothiazide (PRINZIDE,ZESTORETIC) 20-12.5 MG tablet; Take one tablet by mouth once daily. Appointment needed before future refills   Essential hypertension: Uncontrolled chronic condition increasing hydrochlorothiazide Right ankle pain: Given home rehabilitation exercises for inflammation caused by repetitive ankle motions while driving. Encouraged to engage in this rehabilitation plan daily for at least 2-3 weeks.   Return in about 6 months (around 07/20/2016).

## 2016-01-27 ENCOUNTER — Ambulatory Visit: Payer: BLUE CROSS/BLUE SHIELD | Admitting: Family Medicine

## 2016-07-19 ENCOUNTER — Other Ambulatory Visit: Payer: Self-pay | Admitting: Family Medicine

## 2016-07-19 DIAGNOSIS — I10 Essential (primary) hypertension: Secondary | ICD-10-CM

## 2016-09-21 ENCOUNTER — Other Ambulatory Visit: Payer: Self-pay

## 2016-09-21 DIAGNOSIS — I10 Essential (primary) hypertension: Secondary | ICD-10-CM

## 2016-09-21 MED ORDER — LISINOPRIL-HYDROCHLOROTHIAZIDE 20-25 MG PO TABS: 1.0000 | ORAL_TABLET | Freq: Every day | ORAL | 0 refills | Status: DC

## 2016-10-10 ENCOUNTER — Encounter: Payer: Self-pay | Admitting: Physician Assistant

## 2016-10-10 ENCOUNTER — Ambulatory Visit (INDEPENDENT_AMBULATORY_CARE_PROVIDER_SITE_OTHER): Payer: BLUE CROSS/BLUE SHIELD | Admitting: Physician Assistant

## 2016-10-10 VITALS — BP 130/68 | HR 91 | Ht 67.0 in | Wt 275.0 lb

## 2016-10-10 DIAGNOSIS — E785 Hyperlipidemia, unspecified: Secondary | ICD-10-CM | POA: Diagnosis not present

## 2016-10-10 DIAGNOSIS — Z23 Encounter for immunization: Secondary | ICD-10-CM | POA: Diagnosis not present

## 2016-10-10 DIAGNOSIS — I10 Essential (primary) hypertension: Secondary | ICD-10-CM

## 2016-10-10 DIAGNOSIS — R058 Other specified cough: Secondary | ICD-10-CM

## 2016-10-10 DIAGNOSIS — R635 Abnormal weight gain: Secondary | ICD-10-CM

## 2016-10-10 DIAGNOSIS — R05 Cough: Secondary | ICD-10-CM | POA: Diagnosis not present

## 2016-10-10 DIAGNOSIS — Z131 Encounter for screening for diabetes mellitus: Secondary | ICD-10-CM

## 2016-10-10 MED ORDER — PREDNISONE 50 MG PO TABS: ORAL_TABLET | ORAL | 0 refills | Status: DC

## 2016-10-10 MED ORDER — LISINOPRIL-HYDROCHLOROTHIAZIDE 20-25 MG PO TABS: 1.0000 | ORAL_TABLET | Freq: Every day | ORAL | 1 refills | Status: DC

## 2016-10-10 MED ORDER — ALBUTEROL SULFATE HFA 108 (90 BASE) MCG/ACT IN AERS: 2.0000 | INHALATION_SPRAY | Freq: Four times a day (QID) | RESPIRATORY_TRACT | 1 refills | Status: DC | PRN

## 2016-10-10 NOTE — Progress Notes (Addendum)
Subjective:    Patient ID: Russell Orr, male    DOB: 1971-09-12, 45 y.o.   MRN: [id number]  HPI Patient is a 45 yo male with a compliant of a cough times 3 weeks. Patient states that there is occasional mucus, but the cough is more dry than it is productive. Patient reports that when there is mucus, that it is clear. Patient states that the cough is worse at night. He also says his chest has been sore from coughing; however, he denies chest pain and palpitations. Patient states that he has felt fatigued also the past three weeks. Patient has had rhinorrhea. Patient reports that he has a history of recurrent bacterial sinus infections. Patient has not taken any medication for his cough. Patient denies fever or chills or shortness of breath.    Past Medical History:  Diagnosis Date  . Hypertension       Medication List         lisinopril-hydrochlorothiazide 20-25 MG tablet Commonly known as:  PRINZIDE,ZESTORETIC Take 1 tablet by mouth daily.   multivitamin tablet Take 1 tablet by mouth daily.         Social History   Social History  . Marital status: Married    Spouse name: N/A  . Number of children: N/A  . Years of education: N/A   Occupational History  . Not on file.   Social History Main Topics  . Smoking status: Former Smoker    Quit date: 08/14/2008  . Smokeless tobacco: Not on file  . Alcohol use Yes     Comment: occasional  . Drug use: Unknown  . Sexual activity: Not on file     Comment: works Time [employer], married, 2 kids, lifts weights.   Other Topics Concern  . Not on file   Social History Narrative  . No narrative on file   Family History  Problem Relation Age of Onset  . Heart attack Father   . Hypertension Father   . Hyperlipidemia Father   . Bipolar disorder Mother     died ESRD age 43  . Obesity Brother     Review of Systems  Constitutional: Positive for fatigue. Negative for chills and fever.  HENT: Positive for rhinorrhea.  Negative for congestion, ear pain, sinus pain and sinus pressure.   Respiratory: Positive for cough. Negative for chest tightness and shortness of breath.   Cardiovascular: Negative for chest pain and palpitations.       Objective: Vitals:   10/10/16 1400  BP: 130/68  Pulse: 91     Physical Exam  Constitutional: He is oriented to person, place, and time. He appears well-developed and well-nourished.  HENT:  Head: Normocephalic and atraumatic.  Right Ear: External ear normal.  Left Ear: External ear normal.  Nose: Nose normal.  Mouth/Throat: Oropharynx is clear and moist. No oropharyngeal exudate.  TM's clear bilaterally.  Negative for any maxillary or frontal sinus tenderness.   Eyes: Conjunctivae are normal.  Neck: Normal range of motion. Neck supple.  Cardiovascular: Normal rate, regular rhythm and normal heart sounds.  Exam reveals no gallop and no friction rub.   No murmur heard. Pulmonary/Chest: Effort normal. No respiratory distress. He has no wheezes.  Rhonchi present in right upper lobe   Lymphadenopathy:    He has no cervical adenopathy.  Neurological: He is alert and oriented to person, place, and time.  Psychiatric: He has a normal mood and affect. His behavior is normal.  Assessment & Plan:  Russell Orr was seen today for hypertension and dry cough.  Diagnoses and all orders for this visit:  1. Dry cough -     Dry cough with possible reactive airway and or post viral cough syndrome. Start predniSONE (DELTASONE) 50 MG tablet; Take one tablet for 5 days. -    Start albuterol (PROVENTIL HFA;VENTOLIN HFA) 108 (90 Base) MCG/ACT inhaler; Inhale 2 puffs into the lungs every 6 (six) hours as needed for wheezing or shortness of breath.  -     CBC with Differential/Platelet to check for possible infection  -If patients symptoms worsen then patient is advised to call the office and will order CXR.    2. Influenza vaccine needed -     Flu Vaccine QUAD 36+ mos PF IM  (Fluarix & Fluzone Quad Pf) given today at visit.   3. Hyperlipidemia, unspecified hyperlipidemia type -     Lipid panel to evaluate patient's cholesterol   4. Screening for diabetes mellitus -     COMPLETE METABOLIC PANEL WITH GFR to screen for diabetes, check liver function, kidney function and electrolytes.   5. Abnormal weight gain -    Check TSH to evaluate abnormal weight gain. Patient is currently trying to cut out fatty foods and exercise. Patient did not want to try a weight loss medication at this time.   6. HYPERTENSION, BENIGN ESSENTIAL -   Continue  lisinopril-hydrochlorothiazide (PRINZIDE,ZESTORETIC) 20-25 MG tablet; Take 1 tablet by mouth daily. If dry cough persists, then consider changing medication due to a ACE commonly causing a dry cough,

## 2016-10-11 LAB — COMPLETE METABOLIC PANEL WITH GFR
ALBUMIN: 4.4 g/dL (ref 3.6–5.1)
ALK PHOS: 47 U/L (ref 40–115)
ALT: 39 U/L (ref 9–46)
AST: 23 U/L (ref 10–40)
BUN: 11 mg/dL (ref 7–25)
CALCIUM: 9.7 mg/dL (ref 8.6–10.3)
CO2: 28 mmol/L (ref 20–31)
Chloride: 102 mmol/L (ref 98–110)
Creat: 1.11 mg/dL (ref 0.60–1.35)
GFR, EST NON AFRICAN AMERICAN: 81 mL/min (ref >=60)
Glucose, Bld: 98 mg/dL (ref 65–99)
POTASSIUM: 4.4 mmol/L (ref 3.5–5.3)
Sodium: 139 mmol/L (ref 135–146)
Total Bilirubin: 0.3 mg/dL (ref 0.2–1.2)
Total Protein: 7.5 g/dL (ref 6.1–8.1)

## 2016-10-11 LAB — CBC WITH DIFFERENTIAL/PLATELET
BASOS PCT: 0 %
Basophils Absolute: 0 cells/uL (ref 0–200)
EOS PCT: 1 %
Eosinophils Absolute: 82 cells/uL (ref 15–500)
HCT: 41.7 % (ref 38.5–50.0)
HEMOGLOBIN: 13.9 g/dL (ref 13.2–17.1)
LYMPHS ABS: 2788 {cells}/uL (ref 850–3900)
Lymphocytes Relative: 34 %
MCH: 26.6 pg — ABNORMAL LOW (ref 27.0–33.0)
MCHC: 33.3 g/dL (ref 32.0–36.0)
MCV: 79.7 fL — ABNORMAL LOW (ref 80.0–100.0)
MPV: 11.2 fL (ref 7.5–12.5)
Monocytes Absolute: 492 cells/uL (ref 200–950)
Monocytes Relative: 6 %
NEUTROS ABS: 4838 {cells}/uL (ref 1500–7800)
NEUTROS PCT: 59 %
Platelets: 290 10*3/uL (ref 140–400)
RBC: 5.23 MIL/uL (ref 4.20–5.80)
RDW: 15.5 % — ABNORMAL HIGH (ref 11.0–15.0)
WBC: 8.2 10*3/uL (ref 3.8–10.8)

## 2016-10-11 LAB — LIPID PANEL
CHOL/HDL RATIO: 6.3 ratio — AB (ref <5.0)
CHOLESTEROL: 175 mg/dL (ref <200)
HDL: 28 mg/dL — AB (ref >40)
LDL Cholesterol: 105 mg/dL — ABNORMAL HIGH (ref <100)
Triglycerides: 212 mg/dL — ABNORMAL HIGH (ref <150)
VLDL: 42 mg/dL — AB (ref <30)

## 2016-10-11 LAB — TSH: TSH: 2.37 m[IU]/L (ref 0.40–4.50)

## 2016-10-11 NOTE — Progress Notes (Signed)
Call pt:  TSH normal.  LDL improved a lot from 3 years ago.  HDL decrease which we want to increase. Exercise can help with this.  TG increased from 3 years ago. Fish oil 4000mg  can help with this or decreasing sugars/fatty foods/carbs.

## 2017-01-04 ENCOUNTER — Encounter: Payer: Self-pay | Admitting: Emergency Medicine

## 2017-01-04 ENCOUNTER — Emergency Department
Admission: EM | Admit: 2017-01-04 | Discharge: 2017-01-04 | Disposition: A | Discharge status: Home / Self Care | Payer: BLUE CROSS/BLUE SHIELD | Source: Home / Self Care | Attending: Family Medicine | Admitting: Family Medicine

## 2017-01-04 DIAGNOSIS — B9789 Other viral agents as the cause of diseases classified elsewhere: Secondary | ICD-10-CM | POA: Diagnosis not present

## 2017-01-04 DIAGNOSIS — J069 Acute upper respiratory infection, unspecified: Secondary | ICD-10-CM

## 2017-01-04 MED ORDER — GUAIFENESIN-CODEINE 100-10 MG/5ML PO SOLN: ORAL | 0 refills | Status: DC

## 2017-01-04 MED ORDER — AZITHROMYCIN 250 MG PO TABS: ORAL_TABLET | ORAL | 0 refills | Status: DC

## 2017-01-04 NOTE — ED Triage Notes (Signed)
Patient reports extreme fatigue, aches and nasal and chest congestion for past 3 days. No OTCs today.

## 2017-01-04 NOTE — Discharge Instructions (Signed)
Take plain guaifenesin (1200mg extended release tabs such as Mucinex) twice daily, with plenty of water, for cough and congestion.  May add Pseudoephedrine (30mg, one or two every 4 to 6 hours) for sinus congestion.  Get adequate rest.   °May use Afrin nasal spray (or generic oxymetazoline) twice daily for about 5 days and then discontinue.  Also recommend using saline nasal spray several times daily and saline nasal irrigation (AYR is a common brand).    °Try warm salt water gargles for sore throat.  °Stop all antihistamines for now, and other non-prescription cough/cold preparations.. °Begin Azithromycin if not improving about one week or if persistent fever develops  °Follow-up with family doctor if not improving about10 days.  °

## 2017-01-04 NOTE — ED Provider Notes (Signed)
[facility]    CSN: [id number] Arrival date & time: 01/04/17  1331     History   Chief Complaint Chief Complaint  Patient presents with  . Nasal Congestion  . Generalized Body Aches  . Fatigue    HPI Russell Orr is a 46 y.o. male.   Patient complains of three day history of typical cold-like symptoms developing over several days, including mild sore throat, sinus congestion, headache, fatigue, mild myalgias, and cough.    The history is provided by the patient.    Past Medical History:  Diagnosis Date  . Hypertension     Patient Active Problem List   Diagnosis Date Noted  . Hyperlipidemia LDL goal <160 11/23/2012  . Low HDL (under 40) 11/22/2012  . Obesity, unspecified 07/27/2010  . HYPERTENSION, BENIGN ESSENTIAL 07/12/2007    History reviewed. No pertinent surgical history.     Home Medications    Prior to Admission medications   Medication Sig Start Date End Date Taking? Authorizing Provider  albuterol (PROVENTIL HFA;VENTOLIN HFA) 108 (90 Base) MCG/ACT inhaler Inhale 2 puffs into the lungs every 6 (six) hours as needed for wheezing or shortness of breath. 10/10/16   Jade L Breeback, PA-C  azithromycin (ZITHROMAX Z-PAK) 250 MG tablet Take 2 tabs today; then begin one tab once daily for 4 more days. (Rx void after 01/11/17) 01/04/17   Lattie Haw, MD  guaiFENesin-codeine 100-10 MG/5ML syrup Take 10mL by mouth at bedtime as needed for cough 01/04/17   Lattie Haw, MD  lisinopril-hydrochlorothiazide (PRINZIDE,ZESTORETIC) 20-25 MG tablet Take 1 tablet by mouth daily. 10/10/16   Jade L Breeback, PA-C  Multiple Vitamin (MULTIVITAMIN) tablet Take 1 tablet by mouth daily.    Historical Provider, MD    Family History Family History  Problem Relation Age of Onset  . Heart attack Father   . Hypertension Father   . Hyperlipidemia Father   . Heart disease Father   . Bipolar disorder Mother     died ESRD age 78  . Obesity Brother     Social  History Social History  Substance Use Topics  . Smoking status: Former Smoker    Quit date: 08/14/2008  . Smokeless tobacco: Never Used  . Alcohol use Yes     Comment: occasional     Allergies   Patient has no known allergies.   Review of Systems Review of Systems + sore throat + cough No pleuritic pain No wheezing + nasal congestion + post-nasal drainage No sinus pain/pressure No itchy/red eyes No earache No hemoptysis No SOB No fever/chills No nausea No vomiting No abdominal pain No diarrhea No urinary symptoms No skin rash + fatigue + myalgias + headache Used OTC meds without relief   Physical Exam Triage Vital Signs ED Triage Vitals  Enc Vitals Group     BP 01/04/17 1354 115/76     Pulse Rate 01/04/17 1354 94     Resp 01/04/17 1354 16     Temp 01/04/17 1354 98.1 F (36.7 C)     Temp src --      SpO2 01/04/17 1354 97 %     Weight 01/04/17 1355 245 lb (111.1 kg)     Height 01/04/17 1355 5\' 7"  (1.702 m)     Head Circumference --      Peak Flow --      Pain Score 01/04/17 1357 2     Pain Loc --      Pain Edu? --  Excl. in GC? --    No data found.   Updated Vital Signs BP 115/76 (BP Location: Left Arm)   Pulse 94   Temp 98.1 F (36.7 C)   Resp 16   Ht 5\' 7"  (1.702 m)   Wt 245 lb (111.1 kg)   SpO2 97%   BMI 38.37 kg/m   Visual Acuity Right Eye Distance:   Left Eye Distance:   Bilateral Distance:    Right Eye Near:   Left Eye Near:    Bilateral Near:     Physical Exam Nursing notes and Vital Signs reviewed. Appearance:  Patient appears stated age, and in no acute distress Eyes:  Pupils are equal, round, and reactive to light and accomodation.  Extraocular movement is intact.  Conjunctivae are not inflamed  Ears:  Canals normal.  Tympanic membranes normal.  Nose:  Mildly congested turbinates.  No sinus tenderness.    Pharynx:  Normal Neck:  Supple.  Tender enlarged posterior/lateral nodes are palpated bilaterally  Lungs:   Clear to auscultation.  Breath sounds are equal.  Moving air well. Heart:  Regular rate and rhythm without murmurs, rubs, or gallops.  Abdomen:  Nontender without masses or hepatosplenomegaly.  Bowel sounds are present.  No CVA or flank tenderness.  Extremities:  No edema.  Skin:  No rash present.    UC Treatments / Results  Labs (all labs ordered are listed, but only abnormal results are displayed) Labs Reviewed - No data to display  EKG  EKG Interpretation None       Radiology No results found.  Procedures Procedures (including critical care time)  Medications Ordered in UC Medications - No data to display   Initial Impression / Assessment and Plan / UC Course  I have reviewed the triage vital signs and the nursing notes.  Pertinent labs & imaging results that were available during my care of the patient were reviewed by me and considered in my medical decision making (see chart for details).    There is no evidence of bacterial infection today.   Rx for Robitussin AC for night time cough.  Take plain guaifenesin (1200mg  extended release tabs such as Mucinex) twice daily, with plenty of water, for cough and congestion.  May add Pseudoephedrine (30mg , one or two every 4 to 6 hours) for sinus congestion.  Get adequate rest.   May use Afrin nasal spray (or generic oxymetazoline) twice daily for about 5 days and then discontinue.  Also recommend using saline nasal spray several times daily and saline nasal irrigation (AYR is a common brand).   Try warm salt water gargles for sore throat.  Stop all antihistamines for now, and other non-prescription cough/cold preparations. Begin Azithromycin if not improving about one week or if persistent fever develops (Given a prescription to hold, with an expiration date)  Follow-up with family doctor if not improving about10 days.     Final Clinical Impressions(s) / UC Diagnoses   Final diagnoses:  Viral URI with cough    New  Prescriptions New Prescriptions   AZITHROMYCIN (ZITHROMAX Z-PAK) 250 MG TABLET    Take 2 tabs today; then begin one tab once daily for 4 more days. (Rx void after 01/11/17)   GUAIFENESIN-CODEINE 100-10 MG/5ML SYRUP    Take 10mL by mouth at bedtime as needed for cough     Lattie HawStephen A Severiano Utsey, MD 01/05/17 1047

## 2017-04-11 ENCOUNTER — Other Ambulatory Visit: Payer: Self-pay | Admitting: Physician Assistant

## 2017-04-11 DIAGNOSIS — I10 Essential (primary) hypertension: Secondary | ICD-10-CM

## 2017-05-19 ENCOUNTER — Other Ambulatory Visit: Payer: Self-pay | Admitting: Physician Assistant

## 2017-05-19 DIAGNOSIS — I10 Essential (primary) hypertension: Secondary | ICD-10-CM

## 2017-06-20 ENCOUNTER — Other Ambulatory Visit: Payer: Self-pay | Admitting: Physician Assistant

## 2017-06-20 DIAGNOSIS — I10 Essential (primary) hypertension: Secondary | ICD-10-CM

## 2017-07-22 ENCOUNTER — Other Ambulatory Visit: Payer: Self-pay | Admitting: Physician Assistant

## 2017-07-22 DIAGNOSIS — I10 Essential (primary) hypertension: Secondary | ICD-10-CM

## 2017-08-04 ENCOUNTER — Ambulatory Visit: Payer: BLUE CROSS/BLUE SHIELD | Admitting: Physician Assistant

## 2017-08-04 ENCOUNTER — Encounter: Payer: Self-pay | Admitting: Physician Assistant

## 2017-08-04 ENCOUNTER — Ambulatory Visit (INDEPENDENT_AMBULATORY_CARE_PROVIDER_SITE_OTHER): Payer: BLUE CROSS/BLUE SHIELD | Admitting: Physician Assistant

## 2017-08-04 VITALS — BP 133/83 | HR 65 | Wt 262.0 lb

## 2017-08-04 DIAGNOSIS — Z23 Encounter for immunization: Secondary | ICD-10-CM | POA: Diagnosis not present

## 2017-08-04 DIAGNOSIS — I10 Essential (primary) hypertension: Secondary | ICD-10-CM | POA: Diagnosis not present

## 2017-08-04 DIAGNOSIS — E781 Pure hyperglyceridemia: Secondary | ICD-10-CM | POA: Insufficient documentation

## 2017-08-04 DIAGNOSIS — E669 Obesity, unspecified: Secondary | ICD-10-CM | POA: Diagnosis not present

## 2017-08-04 MED ORDER — LISINOPRIL-HYDROCHLOROTHIAZIDE 20-25 MG PO TABS: 1.0000 | ORAL_TABLET | Freq: Every day | ORAL | 4 refills | Status: DC

## 2017-08-04 NOTE — Progress Notes (Signed)
   Subjective:    Patient ID: Russell Orr, male    DOB: 05-Aug-1971, 46 y.o.   MRN: [id number]  HPI  Pt is a 46 yo male who presents to the clinic to follow up on HTN and get medication refill.   HTN- denies any CP, palpitations, headaches or vision changes. Taking medication daily.   Pt is down 13lbs since last November. He is much more active and eating better.   .. Active Ambulatory Problems    Diagnosis Date Noted  . Obesity, unspecified 07/27/2010  . HYPERTENSION, BENIGN ESSENTIAL 07/12/2007  . Low HDL (under 40) 11/22/2012  . Hyperlipidemia LDL goal <160 11/23/2012  . Obesity (BMI 30-39.9) 08/04/2017  . Hypertriglyceridemia 08/04/2017   Resolved Ambulatory Problems    Diagnosis Date Noted  . No Resolved Ambulatory Problems   Past Medical History:  Diagnosis Date  . Hypertension      Review of Systems  All other systems reviewed and are negative.      Objective:   Physical Exam  Constitutional: He is oriented to person, place, and time. He appears well-developed and well-nourished.  Obesity.   HENT:  Head: Normocephalic and atraumatic.  Cardiovascular: Normal rate, regular rhythm and normal heart sounds.   Pulmonary/Chest: Effort normal and breath sounds normal.  Neurological: He is alert and oriented to person, place, and time.  Psychiatric: He has a normal mood and affect. His behavior is normal.          Assessment & Plan:  Marland KitchenMarland KitchenDiagnoses and all orders for this visit:  HYPERTENSION, BENIGN ESSENTIAL -     lisinopril-hydrochlorothiazide (PRINZIDE,ZESTORETIC) 20-25 MG tablet; Take 1 tablet by mouth daily.  Need for immunization against influenza -     Flu Vaccine QUAD 36+ mos IM  Obesity (BMI 30-39.9)   BP looks good.  Refill for one year.  Pt needs a CPE after November with labs.  Marland Kitchen.Discussed low carb diet with 1500 calories and 80g of protein.  Exercising at least 150 minutes a week.  [employer] could be a Chief Technology Officer.

## 2018-10-29 ENCOUNTER — Other Ambulatory Visit: Payer: Self-pay | Admitting: Physician Assistant

## 2018-10-29 DIAGNOSIS — I10 Essential (primary) hypertension: Secondary | ICD-10-CM

## 2020-01-20 ENCOUNTER — Telehealth: Payer: Self-pay

## 2020-01-20 NOTE — Telephone Encounter (Signed)
Maralyn Sago called for information on how her husband could get signed up for the COVID-19 vaccine. I gave her the number to get signed up.   How Essential Workers Can Get a Vaccination through [employer] up begins tomorrow. Frontline essential workers and all others currently eligible for vaccination in [place] can sign up for COVID-19 vaccinations from [facility] starting Friday, March 5.  Tomorrow at 10 a.m., [facility] will open online appointment scheduling at [email or web address] for the following: Those 65 and over  Health care workers  Residents and employees of long-term care facilities  Those who work in childcare and pre-K to 12 education  Frontline essential workers Those without internet access or email accounts should call [phone], Monday through Friday, between 7 a.m. and 7 p.m. for personal assistance.

## 2020-01-24 ENCOUNTER — Other Ambulatory Visit: Payer: Self-pay | Admitting: Physician Assistant

## 2020-01-24 DIAGNOSIS — I10 Essential (primary) hypertension: Secondary | ICD-10-CM

## 2020-01-25 ENCOUNTER — Ambulatory Visit: Payer: BC Managed Care – PPO | Attending: Internal Medicine

## 2020-01-25 DIAGNOSIS — Z23 Encounter for immunization: Secondary | ICD-10-CM

## 2020-01-25 NOTE — Progress Notes (Signed)
   Covid-19 Vaccination Clinic  Name:  Russell Orr    MRN: [id number] DOB: [birth year]  01/25/2020  [name] was observed post Covid-19 immunization for 15 minutes without incident. He was provided with Vaccine Information Sheet and instruction to access the V-Safe system.   [name] was instructed to call 911 with any severe reactions post vaccine: Marland Kitchen Difficulty breathing  . Swelling of face and throat  . A fast heartbeat  . A bad rash all over body  . Dizziness and weakness   Immunizations Administered    Name Date Dose VIS Date Route   Pfizer COVID-19 Vaccine 01/25/2020 12:07 PM 0.3 mL 10/25/2019 Intramuscular   Manufacturer: [employer], [employer]   Lot: [id number]   NDC: [address]-1

## 2020-02-17 ENCOUNTER — Ambulatory Visit: Payer: BC Managed Care – PPO

## 2020-02-17 ENCOUNTER — Ambulatory Visit: Payer: BC Managed Care – PPO | Attending: Family Medicine

## 2020-02-17 DIAGNOSIS — Z23 Encounter for immunization: Secondary | ICD-10-CM

## 2020-02-17 NOTE — Progress Notes (Signed)
   Covid-19 Vaccination Clinic  Name:  Russell Orr    MRN: [id number] DOB: Feb 18, 1971  02/17/2020  Russell Orr was observed post Covid-19 immunization for 15 minutes without incident. He was provided with Vaccine Information Sheet and instruction to access the V-Safe system.   Russell Orr was instructed to call 911 with any severe reactions post vaccine: Marland Kitchen Difficulty breathing  . Swelling of face and throat  . A fast heartbeat  . A bad rash all over body  . Dizziness and weakness   Immunizations Administered    Name Date Dose VIS Date Route   Pfizer COVID-19 Vaccine 02/17/2020  8:19 AM 0.3 mL 10/25/2019 Intramuscular   Manufacturer: [employer], [employer]   Lot: [id number]   NDC: [address]-9

## 2020-02-22 ENCOUNTER — Other Ambulatory Visit: Payer: Self-pay | Admitting: Physician Assistant

## 2020-02-22 DIAGNOSIS — I10 Essential (primary) hypertension: Secondary | ICD-10-CM

## 2020-03-04 ENCOUNTER — Ambulatory Visit (INDEPENDENT_AMBULATORY_CARE_PROVIDER_SITE_OTHER): Payer: BC Managed Care – PPO | Admitting: Physician Assistant

## 2020-03-04 ENCOUNTER — Encounter: Payer: Self-pay | Admitting: Physician Assistant

## 2020-03-04 VITALS — BP 123/78 | HR 70 | Ht 67.0 in | Wt 245.0 lb

## 2020-03-04 DIAGNOSIS — Z6838 Body mass index (BMI) 38.0-38.9, adult: Secondary | ICD-10-CM

## 2020-03-04 DIAGNOSIS — Z131 Encounter for screening for diabetes mellitus: Secondary | ICD-10-CM | POA: Diagnosis not present

## 2020-03-04 DIAGNOSIS — I10 Essential (primary) hypertension: Secondary | ICD-10-CM

## 2020-03-04 DIAGNOSIS — Z13220 Encounter for screening for lipoid disorders: Secondary | ICD-10-CM

## 2020-03-04 DIAGNOSIS — Z23 Encounter for immunization: Secondary | ICD-10-CM

## 2020-03-04 LAB — LIPID PANEL W/REFLEX DIRECT LDL
Cholesterol: 181 mg/dL (ref <200)
HDL: 37 mg/dL — ABNORMAL LOW (ref >=40)
LDL Cholesterol (Calc): 123 mg/dL (calc) — ABNORMAL HIGH
Non-HDL Cholesterol (Calc): 144 mg/dL (calc) — ABNORMAL HIGH (ref <130)
Total CHOL/HDL Ratio: 4.9 (calc) (ref <5.0)
Triglycerides: 100 mg/dL (ref <150)

## 2020-03-04 LAB — COMPLETE METABOLIC PANEL WITH GFR
AG Ratio: 1.7 (calc) (ref 1.0–2.5)
ALT: 17 U/L (ref 9–46)
AST: 15 U/L (ref 10–40)
Albumin: 4.5 g/dL (ref 3.6–5.1)
Alkaline phosphatase (APISO): 54 U/L (ref 36–130)
BUN: 10 mg/dL (ref 7–25)
CO2: 26 mmol/L (ref 20–32)
Calcium: 9.8 mg/dL (ref 8.6–10.3)
Chloride: 105 mmol/L (ref 98–110)
Creat: 0.83 mg/dL (ref 0.60–1.35)
GFR, Est African American: 122 mL/min/{1.73_m2} (ref >=60)
GFR, Est Non African American: 106 mL/min/{1.73_m2} (ref >=60)
Globulin: 2.6 g/dL (calc) (ref 1.9–3.7)
Glucose, Bld: 104 mg/dL — ABNORMAL HIGH (ref 65–99)
Potassium: 4.2 mmol/L (ref 3.5–5.3)
Sodium: 138 mmol/L (ref 135–146)
Total Bilirubin: 0.4 mg/dL (ref 0.2–1.2)
Total Protein: 7.1 g/dL (ref 6.1–8.1)

## 2020-03-04 MED ORDER — LISINOPRIL-HYDROCHLOROTHIAZIDE 20-25 MG PO TABS: 1.0000 | ORAL_TABLET | Freq: Every day | ORAL | 3 refills | Status: DC

## 2020-03-04 NOTE — Progress Notes (Signed)
Subjective:    Patient ID: Russell Orr, male    DOB: 12/24/1970, 49 y.o.   MRN: [id number]  HPI Pt is a 49 yo male who presents to the clinic to follow up on HTN and get refills. He has not been seen in 2 years.   He is doing really well. No concerns. No CP, palpitations, headaches or vision changes. No swelling. Taking medication daily. He is trying to lose weight. Down from 2018 265 to 245 today. He is exercising and eating better.   He did get covid vaccine.   .. Active Ambulatory Problems    Diagnosis Date Noted  . Class 2 severe obesity due to excess calories with serious comorbidity and body mass index (BMI) of 38.0 to 38.9 in adult (HCC) 07/27/2010  . HYPERTENSION, BENIGN ESSENTIAL 07/12/2007  . Low HDL (under 40) 11/22/2012  . Hyperlipidemia LDL goal <160 11/23/2012  . Obesity (BMI 30-39.9) 08/04/2017  . Hypertriglyceridemia 08/04/2017   Resolved Ambulatory Problems    Diagnosis Date Noted  . No Resolved Ambulatory Problems   Past Medical History:  Diagnosis Date  . Hypertension       Review of Systems  All other systems reviewed and are negative.      Objective:   Physical Exam Vitals reviewed.  Constitutional:      Appearance: Normal appearance. He is obese.  Cardiovascular:     Rate and Rhythm: Normal rate and regular rhythm.     Pulses: Normal pulses.     Heart sounds: No murmur.  Pulmonary:     Effort: Pulmonary effort is normal.     Breath sounds: Normal breath sounds.  Musculoskeletal:     Right lower leg: No edema.     Left lower leg: No edema.  Neurological:     General: No focal deficit present.     Mental Status: He is oriented to person, place, and time.  Psychiatric:        Mood and Affect: Mood normal.     .. Depression screen [facility] 2/9 03/04/2020  Decreased Interest 0  Down, Depressed, Hopeless 0  PHQ - 2 Score 0  Altered sleeping 0  Tired, decreased energy 0  Change in appetite 0  Feeling bad or failure about yourself  0   Trouble concentrating 0  Moving slowly or fidgety/restless 0  Suicidal thoughts 0  PHQ-9 Score 0  Difficult doing work/chores Not difficult at all   .Marland Kitchen GAD 7 : Generalized Anxiety Score 03/04/2020  Nervous, Anxious, on Edge 0  Control/stop worrying 0  Worry too much - different things 0  Trouble relaxing 0  Restless 0  Easily annoyed or irritable 0  Afraid - awful might happen 0  Total GAD 7 Score 0  Anxiety Difficulty Not difficult at all          Assessment & Plan:  Marland KitchenMarland KitchenCleston was seen today for hypertension.  Diagnoses and all orders for this visit:  HYPERTENSION, BENIGN ESSENTIAL -     lisinopril-hydrochlorothiazide (ZESTORETIC) 20-25 MG tablet; Take 1 tablet by mouth daily.  Screening for diabetes mellitus -     COMPLETE METABOLIC PANEL WITH GFR  Screening for lipid disorders -     Lipid Panel w/reflex Direct LDL  Need for Tdap vaccination -     Tdap vaccine greater than or equal to 7yo IM  Class 2 severe obesity due to excess calories with serious comorbidity and body mass index (BMI) of 38.0 to 38.9 in adult [facility])  BP great. Refilled for 1 year.  Continue to work on weight loss and healthy living.  Screening labs ordered today.  Tdap given.

## 2020-03-06 NOTE — Progress Notes (Signed)
Damonta,   LDL good. Kidney and liver look great. Fasting sugar a little elevated. Suggest recheck in 6 months with a1c to make sure not trending up.

## 2020-11-05 DIAGNOSIS — Z20822 Contact with and (suspected) exposure to covid-19: Secondary | ICD-10-CM | POA: Diagnosis not present

## 2021-03-15 ENCOUNTER — Other Ambulatory Visit: Payer: Self-pay | Admitting: Physician Assistant

## 2021-03-15 DIAGNOSIS — I10 Essential (primary) hypertension: Secondary | ICD-10-CM

## 2021-05-02 ENCOUNTER — Encounter: Payer: Self-pay | Admitting: Emergency Medicine

## 2021-05-02 ENCOUNTER — Emergency Department
Admission: EM | Admit: 2021-05-02 | Discharge: 2021-05-02 | Disposition: A | Discharge status: Home / Self Care | Payer: BC Managed Care – PPO | Source: Home / Self Care

## 2021-05-02 ENCOUNTER — Other Ambulatory Visit: Payer: Self-pay

## 2021-05-02 DIAGNOSIS — T148XXA Other injury of unspecified body region, initial encounter: Secondary | ICD-10-CM

## 2021-05-02 DIAGNOSIS — M62830 Muscle spasm of back: Secondary | ICD-10-CM

## 2021-05-02 DIAGNOSIS — L539 Erythematous condition, unspecified: Secondary | ICD-10-CM

## 2021-05-02 DIAGNOSIS — M546 Pain in thoracic spine: Secondary | ICD-10-CM

## 2021-05-02 MED ORDER — CYCLOBENZAPRINE HCL 10 MG PO TABS: 10.0000 mg | ORAL_TABLET | Freq: Two times a day (BID) | ORAL | 0 refills | Status: DC | PRN

## 2021-05-02 MED ORDER — VALACYCLOVIR HCL 1 G PO TABS: 1000.0000 mg | ORAL_TABLET | Freq: Three times a day (TID) | ORAL | 0 refills | Status: AC

## 2021-05-02 NOTE — ED Triage Notes (Signed)
Patient c/o back pain for over a week.  No apparent injury to the area.  The pain is more in the middle of his back now a "lump" has appeared.  This morning he awoke with a headache.  Patient questioning rather it's shingles or not.  Home COVID test was negative.  Taking Ibuprofen and Tylenol for pain.

## 2021-05-02 NOTE — ED Provider Notes (Signed)
[facility]   [id number] 05/02/21 Arrival Time: 0849  UX:LKGMW PAIN  SUBJECTIVE: History from: patient. Meric Joye is a 50 y.o. male complains of left thoracic back pain that began one week ago. Also reports localized erythema and swelling to the left thoracic back. He is concerned that he may have shingles. Reports that he thought he pulled a muscle at work.  Describes the pain as constant and achy in character with intermittent sharp pain and spasm.  Has tried OTC medications without relief. Symptoms are made worse with activity. Denies similar symptoms in the past.  Denies fever, chills, ecchymosis, effusion, weakness, numbness and tingling, saddle paresthesias, loss of bowel or bladder function.      ROS: As per HPI.  All other pertinent ROS negative.     Past Medical History:  Diagnosis Date   Hypertension    History reviewed. No pertinent surgical history. No Known Allergies No current facility-administered medications on file prior to encounter.   Current Outpatient Medications on File Prior to Encounter  Medication Sig Dispense Refill   aspirin 81 MG chewable tablet Chew by mouth daily.     lisinopril-hydrochlorothiazide (ZESTORETIC) 20-25 MG tablet Take 1 tablet by mouth daily. appt for refills 90 tablet 0   Multiple Vitamin (MULTIVITAMIN) tablet Take 1 tablet by mouth daily.     albuterol (PROVENTIL HFA;VENTOLIN HFA) 108 (90 Base) MCG/ACT inhaler Inhale 2 puffs into the lungs every 6 (six) hours as needed for wheezing or shortness of breath. 1 Inhaler 1   Social History   Socioeconomic History   Marital status: Married    Spouse name: Not on file   Number of children: Not on file   Years of education: Not on file   Highest education level: Not on file  Occupational History   Not on file  Tobacco Use   Smoking status: Former    Pack years: 0.00    Types: Cigarettes    Quit date: 08/14/2008    Years since quitting: 12.7   Smokeless tobacco: Never   Substance and Sexual Activity   Alcohol use: Yes    Comment: occasional   Drug use: Not on file   Sexual activity: Not on file    Comment: works [employer], married, 2 kids, lifts weights.  Other Topics Concern   Not on file  Social History Narrative   Not on file   Social Determinants of Health   Financial Resource Strain: Not on file  Food Insecurity: Not on file  Transportation Needs: Not on file  Physical Activity: Not on file  Stress: Not on file  Social Connections: Not on file  Intimate Partner Violence: Not on file   Family History  Problem Relation Age of Onset   Heart attack Father    Hypertension Father    Hyperlipidemia Father    Heart disease Father    Bipolar disorder Mother        died ESRD age 60   Obesity Brother     OBJECTIVE:  Vitals:   05/02/21 0913  BP: 138/87  Pulse: 71  Temp: 98.5 F (36.9 C)  TempSrc: Oral  SpO2: 97%    General appearance: ALERT; in no acute distress.  Head: NCAT Lungs: Normal respiratory effort CV: pulses 2+ bilaterally. Cap refill < 2 seconds Musculoskeletal:  Inspection: Skin warm, dry, clear and intact Localized 1cm area of erythema and tendnerness to the L thoracic back, no effusion noted Palpation: L thoracic back tender to palpation,  exquisitely tender at area of erythema ROM: FROM active and passive Skin: warm and dry Neurologic: Ambulates without difficulty; Sensation intact about the upper/ lower extremities Psychological: alert and cooperative; normal mood and affect  DIAGNOSTIC STUDIES:  No results found.   ASSESSMENT & PLAN:  1. Muscle strain   2. Muscle spasm of back   3. Acute left-sided thoracic back pain   4. Erythema     Meds ordered this encounter  Medications   cyclobenzaprine (FLEXERIL) 10 MG tablet    Sig: Take 1 tablet (10 mg total) by mouth 2 (two) times daily as needed for muscle spasms.    Dispense:  20 tablet    Refill:  0    Order Specific Question:   Supervising  Provider    Answer:   Merrilee Jansky [[id number]]   valACYclovir (VALTREX) 1000 MG tablet    Sig: Take 1 tablet (1,000 mg total) by mouth 3 (three) times daily for 7 days.    Dispense:  21 tablet    Refill:  0    Order Specific Question:   Supervising Provider    Answer:   Merrilee Jansky [[id number]]    Continue conservative management of rest, ice, and gentle stretches May take 800 mg ibuprofen with 1000 mg of Tylenol.  Do not exceed 4000 mg of Tylenol in 24 hours. Take cyclobenzaprine at nighttime for symptomatic relief. Avoid driving or operating heavy machinery while using medication. Follow up with PCP if symptoms persist Return or go to the ER if you have any new or worsening symptoms (fever, chills, chest pain, abdominal pain, changes in bowel or bladder habits, pain radiating into lower legs)  Prescribed valtrex for possible beginning of shingles given the localized erythema and tenderness  Reviewed expectations re: course of current medical issues. Questions answered. Outlined signs and symptoms indicating need for more acute intervention. Patient verbalized understanding. After Visit Summary given.        Moshe Cipro, NP 05/02/21 1001

## 2021-05-02 NOTE — Discharge Instructions (Addendum)
Prescribed valtrex 1000mg  3 times per day for 7 days  May take 800 mg ibuprofen with 1000 mg of Tylenol.  Do not exceed 4000 mg of Tylenol in 24 hours.  I have sent in flexeril for you to take twice a day as needed for muscle spasms. This medication can make you sleepy. Do not drive or operate heavy machinery with this medication.  Follow up with this office or with primary care if symptoms are persisting.  Follow up in the ER for high fever, trouble swallowing, trouble breathing, other concerning symptoms.

## 2021-05-04 ENCOUNTER — Telehealth: Payer: Self-pay | Admitting: *Deleted

## 2021-05-04 NOTE — Telephone Encounter (Signed)
Patient's wife called asking for a return to work note for [employer] for 2 days. 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 Work note for 2 days left at front desk for his wife Maralyn Sago to pick up.

## 2021-06-14 ENCOUNTER — Other Ambulatory Visit: Payer: Self-pay | Admitting: Physician Assistant

## 2021-06-14 DIAGNOSIS — I10 Essential (primary) hypertension: Secondary | ICD-10-CM

## 2021-06-16 ENCOUNTER — Telehealth: Payer: Self-pay

## 2021-06-16 DIAGNOSIS — I10 Essential (primary) hypertension: Secondary | ICD-10-CM

## 2021-06-16 MED ORDER — LISINOPRIL-HYDROCHLOROTHIAZIDE 20-25 MG PO TABS: 1.0000 | ORAL_TABLET | Freq: Every day | ORAL | 0 refills | Status: DC

## 2021-06-16 NOTE — Telephone Encounter (Signed)
Pt's spouse called requesting refill of lisinopril.  30 day RX sent to pharmacy and pt scheduled for follow up on 07/06/2021 with pt's spouse.  Tiajuana Amass, CMA

## 2021-07-06 ENCOUNTER — Ambulatory Visit: Payer: BC Managed Care – PPO | Admitting: Family Medicine

## 2021-07-06 ENCOUNTER — Encounter: Payer: Self-pay | Admitting: Family Medicine

## 2021-07-06 ENCOUNTER — Other Ambulatory Visit: Payer: Self-pay

## 2021-07-06 VITALS — BP 143/83 | HR 64 | Temp 98.5°F | Ht 67.0 in | Wt 274.0 lb

## 2021-07-06 DIAGNOSIS — Z114 Encounter for screening for human immunodeficiency virus [HIV]: Secondary | ICD-10-CM

## 2021-07-06 DIAGNOSIS — Z1159 Encounter for screening for other viral diseases: Secondary | ICD-10-CM

## 2021-07-06 DIAGNOSIS — I10 Essential (primary) hypertension: Secondary | ICD-10-CM

## 2021-07-06 NOTE — Progress Notes (Signed)
Established Patient Office Visit  Subjective:  Patient ID: Russell Orr, male    DOB: 1971/01/29  Age: 50 y.o. MRN: [id number]  CC:  Chief Complaint  Patient presents with   Hypertension     HPI Russell Orr presents for   Hypertension- Pt denies chest pain, SOB, dizziness, or heart palpitations.  Taking meds as directed w/o problems.  Denies medication side effects.  That he was planning on try to work on losing some weight this year and just did not.  He said that thing, got in the way.  But he is planning on getting back on track he says his new job that he got is more physically active which is good.   Past Medical History:  Diagnosis Date   Hypertension     History reviewed. No pertinent surgical history.  Family History  Problem Relation Age of Onset   Heart attack Father    Hypertension Father    Hyperlipidemia Father    Heart disease Father    Bipolar disorder Mother        died ESRD age 86   Obesity Brother     Social History   Socioeconomic History   Marital status: Married    Spouse name: Not on file   Number of children: Not on file   Years of education: Not on file   Highest education level: Not on file  Occupational History   Not on file  Tobacco Use   Smoking status: Former    Types: Cigarettes    Quit date: 08/14/2008    Years since quitting: 12.9   Smokeless tobacco: Never  Substance and Sexual Activity   Alcohol use: Yes    Comment: occasional   Drug use: Not on file   Sexual activity: Not on file    Comment: works [employer], married, 2 kids, lifts weights.  Other Topics Concern   Not on file  Social History Narrative   Not on file   Social Determinants of Health   Financial Resource Strain: Not on file  Food Insecurity: Not on file  Transportation Needs: Not on file  Physical Activity: Not on file  Stress: Not on file  Social Connections: Not on file  Intimate Partner Violence: Not on file    Outpatient  Medications Prior to Visit  Medication Sig Dispense Refill   albuterol (PROVENTIL HFA;VENTOLIN HFA) 108 (90 Base) MCG/ACT inhaler Inhale 2 puffs into the lungs every 6 (six) hours as needed for wheezing or shortness of breath. 1 Inhaler 1   aspirin 81 MG chewable tablet Chew by mouth daily.     Multiple Vitamin (MULTIVITAMIN) tablet Take 1 tablet by mouth daily.     lisinopril-hydrochlorothiazide (ZESTORETIC) 20-25 MG tablet Take 1 tablet by mouth daily. appt for refills 30 tablet 0   cyclobenzaprine (FLEXERIL) 10 MG tablet Take 1 tablet (10 mg total) by mouth 2 (two) times daily as needed for muscle spasms. 20 tablet 0   No facility-administered medications prior to visit.    No Known Allergies  ROS Review of Systems    Objective:    Physical Exam Constitutional:      Appearance: Normal appearance. He is well-developed.  HENT:     Head: Normocephalic and atraumatic.  Cardiovascular:     Rate and Rhythm: Normal rate and regular rhythm.     Heart sounds: Normal heart sounds.  Pulmonary:     Effort: Pulmonary effort is normal.     Breath  sounds: Normal breath sounds.  Skin:    General: Skin is warm and dry.  Neurological:     Mental Status: He is alert and oriented to person, place, and time. Mental status is at baseline.  Psychiatric:        Behavior: Behavior normal.    BP (!) 143/83   Pulse 64   Temp 98.5 F (36.9 C)   Ht $R'5\' 7"'vn$  (1.702 m)   Wt 274 lb (124.3 kg)   SpO2 98%   BMI 42.91 kg/m  Wt Readings from Last 3 Encounters:  07/06/21 274 lb (124.3 kg)  03/04/20 245 lb (111.1 kg)  08/04/17 262 lb (118.8 kg)     Health Maintenance Due  Topic Date Due   Hepatitis C Screening  Never done   INFLUENZA VACCINE  06/14/2021    There are no preventive care reminders to display for this patient.  Lab Results  Component Value Date   TSH 2.37 10/10/2016   Lab Results  Component Value Date   WBC 9.2 07/06/2021   HGB 14.6 07/06/2021   HCT 45.2 07/06/2021   MCV  84.8 07/06/2021   PLT 298 07/06/2021   Lab Results  Component Value Date   NA 136 07/06/2021   K 4.8 07/06/2021   CO2 29 07/06/2021   GLUCOSE 93 07/06/2021   BUN 17 07/06/2021   CREATININE 1.05 07/06/2021   BILITOT 0.4 07/06/2021   ALKPHOS 47 10/10/2016   AST 19 07/06/2021   ALT 28 07/06/2021   PROT 7.7 07/06/2021   ALBUMIN 4.4 10/10/2016   CALCIUM 10.1 07/06/2021   EGFR 88 07/06/2021   Lab Results  Component Value Date   CHOL 227 (H) 07/06/2021   Lab Results  Component Value Date   HDL 50 07/06/2021   Lab Results  Component Value Date   LDLCALC 139 (H) 07/06/2021   Lab Results  Component Value Date   TRIG 250 (H) 07/06/2021   Lab Results  Component Value Date   CHOLHDL 4.5 07/06/2021   No results found for: HGBA1C    Assessment & Plan:   Problem List Items Addressed This Visit       Cardiovascular and Mediastinum   HYPERTENSION, BENIGN ESSENTIAL - Primary (Chronic)    Blood pressure not at goal.  Continue to work on healthy diet regular exercise low-sodium intake.  Will switch lisinopril HCT to valsartan HCT to maybe get just a little bit more drop in blood pressure if still not at goal over the next month or 2 then can add low-dose of amlodipine.      Relevant Medications   valsartan-hydrochlorothiazide (DIOVAN-HCT) 320-25 MG tablet   Other Relevant Orders   Lipid Panel w/reflex Direct LDL (Completed)   COMPLETE METABOLIC PANEL WITH GFR (Completed)   CBC (Completed)   Hepatitis C antibody   HIV Antibody (routine testing w rflx)   Other Visit Diagnoses     Screening for HIV (human immunodeficiency virus)       Relevant Orders   HIV Antibody (routine testing w rflx)   Need for hepatitis C screening test       Relevant Orders   Hepatitis C antibody       Meds ordered this encounter  Medications   valsartan-hydrochlorothiazide (DIOVAN-HCT) 320-25 MG tablet    Sig: Take 1 tablet by mouth daily.    Dispense:  90 tablet    Refill:  1      Follow-up: No follow-ups on file.    Barnetta Chapel  Madilyn Fireman, MD

## 2021-07-07 ENCOUNTER — Encounter: Payer: Self-pay | Admitting: Family Medicine

## 2021-07-07 ENCOUNTER — Telehealth: Payer: Self-pay | Admitting: Family Medicine

## 2021-07-07 DIAGNOSIS — Z8249 Family history of ischemic heart disease and other diseases of the circulatory system: Secondary | ICD-10-CM | POA: Insufficient documentation

## 2021-07-07 LAB — LIPID PANEL W/REFLEX DIRECT LDL
Cholesterol: 227 mg/dL — ABNORMAL HIGH (ref <200)
HDL: 50 mg/dL (ref >=40)
LDL Cholesterol (Calc): 139 mg/dL (calc) — ABNORMAL HIGH
Non-HDL Cholesterol (Calc): 177 mg/dL (calc) — ABNORMAL HIGH (ref <130)
Total CHOL/HDL Ratio: 4.5 (calc) (ref <5.0)
Triglycerides: 250 mg/dL — ABNORMAL HIGH (ref <150)

## 2021-07-07 LAB — COMPLETE METABOLIC PANEL WITH GFR
AG Ratio: 1.7 (calc) (ref 1.0–2.5)
ALT: 28 U/L (ref 9–46)
AST: 19 U/L (ref 10–40)
Albumin: 4.8 g/dL (ref 3.6–5.1)
Alkaline phosphatase (APISO): 52 U/L (ref 36–130)
BUN: 17 mg/dL (ref 7–25)
CO2: 29 mmol/L (ref 20–32)
Calcium: 10.1 mg/dL (ref 8.6–10.3)
Chloride: 99 mmol/L (ref 98–110)
Creat: 1.05 mg/dL (ref 0.60–1.29)
Globulin: 2.9 g/dL (calc) (ref 1.9–3.7)
Glucose, Bld: 93 mg/dL (ref 65–99)
Potassium: 4.8 mmol/L (ref 3.5–5.3)
Sodium: 136 mmol/L (ref 135–146)
Total Bilirubin: 0.4 mg/dL (ref 0.2–1.2)
Total Protein: 7.7 g/dL (ref 6.1–8.1)
eGFR: 88 mL/min/{1.73_m2} (ref >=60)

## 2021-07-07 LAB — HEPATITIS C ANTIBODY
Hepatitis C Ab: NONREACTIVE
SIGNAL TO CUT-OFF: 0.02 (ref <1.00)

## 2021-07-07 LAB — CBC
HCT: 45.2 % (ref 38.5–50.0)
Hemoglobin: 14.6 g/dL (ref 13.2–17.1)
MCH: 27.4 pg (ref 27.0–33.0)
MCHC: 32.3 g/dL (ref 32.0–36.0)
MCV: 84.8 fL (ref 80.0–100.0)
MPV: 11.6 fL (ref 7.5–12.5)
Platelets: 298 10*3/uL (ref 140–400)
RBC: 5.33 10*6/uL (ref 4.20–5.80)
RDW: 14.6 % (ref 11.0–15.0)
WBC: 9.2 10*3/uL (ref 3.8–10.8)

## 2021-07-07 LAB — HIV ANTIBODY (ROUTINE TESTING W REFLEX): HIV 1&2 Ab, 4th Generation: NONREACTIVE

## 2021-07-07 MED ORDER — VALSARTAN-HYDROCHLOROTHIAZIDE 320-25 MG PO TABS: 1.0000 | ORAL_TABLET | Freq: Every day | ORAL | 1 refills | Status: DC

## 2021-07-07 NOTE — Progress Notes (Signed)
Hi Orton, you are negative for hepatitis C.  Also negative for HIV.

## 2021-07-07 NOTE — Telephone Encounter (Signed)
Called and spoke with pt who is agreeable to starting new medication.  Pt was scheduled for nurse f/u appt on 07/23/2021 at 8:30am.  Tiajuana Amass, CMA

## 2021-07-07 NOTE — Telephone Encounter (Signed)
Please call patient and let him know that MGUS which is lisinopril HCTZ to a similar medication called valsartan HCTZ to see if we can get his pressure down just a little bit lower.  They are very similar so I do not think I will notice a difference in how he feels but I am hoping we can get at least an additional 5-8 point drop on his pressure.  Low for him to come back in 2 to 3 weeks for nurse blood pressure check to make sure that it is coming down continue to work on low-sodium diet regular exercise and healthy food choices.

## 2021-07-07 NOTE — Assessment & Plan Note (Signed)
Blood pressure not at goal.  Continue to work on healthy diet regular exercise low-sodium intake.  Will switch lisinopril HCT to valsartan HCT to maybe get just a little bit more drop in blood pressure if still not at goal over the next month or 2 then can add low-dose of amlodipine.

## 2021-07-19 ENCOUNTER — Other Ambulatory Visit: Payer: Self-pay | Admitting: Family Medicine

## 2021-07-19 DIAGNOSIS — I10 Essential (primary) hypertension: Secondary | ICD-10-CM

## 2021-07-23 ENCOUNTER — Ambulatory Visit (INDEPENDENT_AMBULATORY_CARE_PROVIDER_SITE_OTHER): Payer: BC Managed Care – PPO | Admitting: Sports Medicine

## 2021-07-23 ENCOUNTER — Other Ambulatory Visit: Payer: Self-pay

## 2021-07-23 VITALS — BP 126/75 | HR 73

## 2021-07-23 DIAGNOSIS — I10 Essential (primary) hypertension: Secondary | ICD-10-CM | POA: Diagnosis not present

## 2021-07-23 NOTE — Progress Notes (Signed)
Established Patient Nurse Visit  Subjective:  Patient ID: Russell Orr, male    DOB: Jul 30, 1971  Age: 50 y.o. MRN: [id number]  CC:  Chief Complaint  Patient presents with   Hypertension    HPI Russell Orr presents for blood pressure check.   Russell Orr is taking Valsartan-HCTZ 320-25 mg one tablet daily for blood pressure control. He states one missed dose on Sunday. He has only been on medication for about 10 days, this was changed at his last visit.  Russell Orr hasn't been checking blood pressure readings at home.    Past Medical History:  Diagnosis Date   Hypertension     No past surgical history on file.  Family History  Problem Relation Age of Onset   Bipolar disorder Mother        died ESRD age 8   Heart attack Father 80   Hypertension Father    Hyperlipidemia Father    Heart disease Father    Obesity Brother     Social History   Socioeconomic History   Marital status: Married    Spouse name: Not on file   Number of children: Not on file   Years of education: Not on file   Highest education level: Not on file  Occupational History   Not on file  Tobacco Use   Smoking status: Former    Types: Cigarettes    Quit date: 08/14/2008    Years since quitting: 12.9   Smokeless tobacco: Never  Substance and Sexual Activity   Alcohol use: Yes    Comment: occasional   Drug use: Not on file   Sexual activity: Not on file    Comment: works Time Russell Orr, married, 2 kids, lifts weights.  Other Topics Concern   Not on file  Social History Narrative   Not on file   Social Determinants of Health   Financial Resource Strain: Not on file  Food Insecurity: Not on file  Transportation Needs: Not on file  Physical Activity: Not on file  Stress: Not on file  Social Connections: Not on file  Intimate Partner Violence: Not on file    Outpatient Medications Prior to Visit  Medication Sig Dispense Refill   albuterol (PROVENTIL  HFA;VENTOLIN HFA) 108 (90 Base) MCG/ACT inhaler Inhale 2 puffs into the lungs every 6 (six) hours as needed for wheezing or shortness of breath. 1 Inhaler 1   aspirin 81 MG chewable tablet Chew by mouth daily.     Multiple Vitamin (MULTIVITAMIN) tablet Take 1 tablet by mouth daily.     valsartan-hydrochlorothiazide (DIOVAN-HCT) 320-25 MG tablet Take 1 tablet by mouth daily. 90 tablet 1   No facility-administered medications prior to visit.    No Known Allergies  ROS Review of Systems  Respiratory:  Negative for shortness of breath.   Cardiovascular:  Negative for chest pain and palpitations.  Neurological:  Negative for syncope and headaches.     Objective:      BP 126/75   Pulse 73   SpO2 98%  Wt Readings from Last 3 Encounters:  07/06/21 274 lb (124.3 kg)  03/04/20 245 lb (111.1 kg)  08/04/17 262 lb (118.8 kg)     Health Maintenance Due  Topic Date Due   INFLUENZA VACCINE  06/14/2021    There are no preventive care reminders to display for this patient.  Lab Results  Component Value Date   TSH 2.37 10/10/2016   Lab Results  Component Value  Date   WBC 9.2 07/06/2021   HGB 14.6 07/06/2021   HCT 45.2 07/06/2021   MCV 84.8 07/06/2021   PLT 298 07/06/2021   Lab Results  Component Value Date   NA 136 07/06/2021   K 4.8 07/06/2021   CO2 29 07/06/2021   GLUCOSE 93 07/06/2021   BUN 17 07/06/2021   CREATININE 1.05 07/06/2021   BILITOT 0.4 07/06/2021   ALKPHOS 47 10/10/2016   AST 19 07/06/2021   ALT 28 07/06/2021   PROT 7.7 07/06/2021   ALBUMIN 4.4 10/10/2016   CALCIUM 10.1 07/06/2021   EGFR 88 07/06/2021   Lab Results  Component Value Date   CHOL 227 (H) 07/06/2021   Lab Results  Component Value Date   HDL 50 07/06/2021   Lab Results  Component Value Date   LDLCALC 139 (H) 07/06/2021   Lab Results  Component Value Date   TRIG 250 (H) 07/06/2021   Lab Results  Component Value Date   CHOLHDL 4.5 07/06/2021      Assessment & Plan:    Problem List Items Addressed This Visit   None  Patient's blood pressure stabilized on new medication. He will follow up with Russell Orr in 6 months for blood pressure follow up and call with any issues in the interim.    Follow-up: 6 months

## 2021-12-15 ENCOUNTER — Other Ambulatory Visit: Payer: Self-pay

## 2021-12-15 ENCOUNTER — Emergency Department
Admission: EM | Admit: 2021-12-15 | Discharge: 2021-12-15 | Disposition: A | Discharge status: Home / Self Care | Payer: BC Managed Care – PPO | Source: Home / Self Care

## 2021-12-15 ENCOUNTER — Emergency Department (INDEPENDENT_AMBULATORY_CARE_PROVIDER_SITE_OTHER): Payer: BC Managed Care – PPO

## 2021-12-15 DIAGNOSIS — S99922A Unspecified injury of left foot, initial encounter: Secondary | ICD-10-CM

## 2021-12-15 DIAGNOSIS — M79672 Pain in left foot: Secondary | ICD-10-CM | POA: Diagnosis not present

## 2021-12-15 MED ORDER — CELECOXIB 100 MG PO CAPS: 100.0000 mg | ORAL_CAPSULE | Freq: Two times a day (BID) | ORAL | 0 refills | Status: AC

## 2021-12-15 NOTE — ED Provider Notes (Signed)
[facility]    CSN: [id number] Arrival date & time: 12/15/21  6010      History   Chief Complaint Chief Complaint  Patient presents with   Foot Injury    Left foot injury x4 days    HPI Russell Orr is a 51 y.o. male.   HPI 51 year old male presents with left foot injury for 4 days.  Reports right forefoot/midfoot swelling and pain when weightbearing/walking.  Past Medical History:  Diagnosis Date   Hypertension     Patient Active Problem List   Diagnosis Date Noted   Family history of premature coronary heart disease 07/07/2021   Obesity (BMI 30-39.9) 08/04/2017   Hypertriglyceridemia 08/04/2017   Hyperlipidemia LDL goal <160 11/23/2012   Low HDL (under 40) 11/22/2012   Class 2 severe obesity due to excess calories with serious comorbidity and body mass index (BMI) of 38.0 to 38.9 in adult [facility]) 07/27/2010   HYPERTENSION, BENIGN ESSENTIAL 07/12/2007    History reviewed. No pertinent surgical history.     Home Medications    Prior to Admission medications   Medication Sig Start Date End Date Taking? Authorizing Provider  aspirin 81 MG chewable tablet Chew by mouth daily.   Yes [provider]  celecoxib (CELEBREX) 100 MG capsule Take 1 capsule (100 mg total) by mouth 2 (two) times daily for 15 days. 12/15/21 12/30/21 Yes Trevor Iha, FNP  Multiple Vitamin (MULTIVITAMIN) tablet Take 1 tablet by mouth daily.   Yes [provider]  valsartan-hydrochlorothiazide (DIOVAN-HCT) 320-25 MG tablet Take 1 tablet by mouth daily. 07/07/21  Yes Agapito Games, MD  albuterol (PROVENTIL HFA;VENTOLIN HFA) 108 (90 Base) MCG/ACT inhaler Inhale 2 puffs into the lungs every 6 (six) hours as needed for wheezing or shortness of breath. 10/10/16   Jomarie Longs, PA-C    Family History Family History  Problem Relation Age of Onset   Bipolar disorder Mother        died ESRD age 64   Heart attack Father 72   Hypertension Father     Hyperlipidemia Father    Heart disease Father    Obesity Brother     Social History Social History   Tobacco Use   Smoking status: Former    Types: Cigarettes    Quit date: 08/14/2008    Years since quitting: 13.3   Smokeless tobacco: Never  Substance Use Topics   Alcohol use: Yes    Comment: occasional     Allergies   Patient has no known allergies.   Review of Systems Review of Systems  Musculoskeletal:        Left foot injury x4 days  All other systems reviewed and are negative.   Physical Exam Triage Vital Signs ED Triage Vitals  Enc Vitals Group     BP 12/15/21 0828 (!) 154/92     Pulse Rate 12/15/21 0828 79     Resp 12/15/21 0828 20     Temp 12/15/21 0828 98.5 F (36.9 C)     Temp Source 12/15/21 0828 Oral     SpO2 12/15/21 0828 96 %     Weight 12/15/21 0826 270 lb (122.5 kg)     Height 12/15/21 0826 5\' 7"  (1.702 m)     Head Circumference --      Peak Flow --      Pain Score 12/15/21 0826 7     Pain Loc --      Pain Edu? --  Excl. in GC? --    No data found.  Updated Vital Signs BP (!) 154/92 (BP Location: Left Arm)    Pulse 79    Temp 98.5 F (36.9 C) (Oral)    Resp 20    Ht 5\' 7"  (1.702 m)    Wt 270 lb (122.5 kg)    SpO2 96%    BMI 42.29 kg/m      Physical Exam Vitals and nursing note reviewed.  Constitutional:      General: Russell Orr is not in acute distress.    Appearance: Russell Orr is obese. Russell Orr is not ill-appearing.  HENT:     Head: Normocephalic and atraumatic.     Mouth/Throat:     Mouth: Mucous membranes are moist.     Pharynx: Oropharynx is clear.  Eyes:     Extraocular Movements: Extraocular movements intact.     Conjunctiva/sclera: Conjunctivae normal.     Pupils: Pupils are equal, round, and reactive to light.  Cardiovascular:     Rate and Rhythm: Normal rate and regular rhythm.     Pulses: Normal pulses.     Heart sounds: Normal heart sounds. No murmur heard.   No friction rub. No gallop.  Pulmonary:     Effort: Pulmonary effort  is normal.     Breath sounds: Normal breath sounds.  Musculoskeletal:        General: Normal range of motion.     Cervical back: Normal range of motion and neck supple.     Comments: Left foot (dorsum): TTP over midfoot/forefoot with moderate soft tissue swelling noted  Skin:    General: Skin is warm and dry.  Neurological:     General: No focal deficit present.     Mental Status: Russell Orr is alert and oriented to person, place, and time. Mental status is at baseline.     UC Treatments / Results  Labs (all labs ordered are listed, but only abnormal results are displayed) Labs Reviewed - No data to display  EKG   Radiology DG Foot Complete Left  Result Date: 12/15/2021 CLINICAL DATA:  Left foot injury. EXAM: LEFT FOOT - COMPLETE 3+ VIEW COMPARISON:  None. FINDINGS: Suspected mild diffuse soft tissue swelling about the forefoot. Peripherally corticated ossicle adjacent to the IP joint of the great toe likely represents an accessory ossicle. No fracture or dislocation. Joint spaces are preserved. No erosions. No significant hallux valgus deformity. Small plantar calcaneal spur. Note is made of a tiny os trigonum. No radiopaque foreign body. IMPRESSION: Suspected mild diffuse soft tissue swelling about the forefoot without associated fracture or radiopaque foreign body. Electronically Signed   By: 02/12/2022 M.D.   On: 12/15/2021 08:41    Procedures Procedures (including critical care time)  Medications Ordered in UC Medications - No data to display  Initial Impression / Assessment and Plan / UC Course  I have reviewed the triage vital signs and the nursing notes.  Pertinent labs & imaging results that were available during my care of the patient were reviewed by me and considered in my medical decision making (see chart for details).     MDM: 1.  Foot injury, left, initial encounter-Rx'd Celebrex. Advised patient to take medication as directed with food to completion.  Encouraged  patient to increase daily water intake while taking this medication.  Advised patient to RICE affected area of left foot for 25 minutes 3 times today and tomorrow, Thursday, 11/15/2021.  Instructed patient to stay off left foot is much  as possible for the next 2 days.  Advised patient if foot worsens and/or unresolved [facility] (contact information provided above with this AVS). Final Clinical Impressions(s) / UC Diagnoses   Final diagnoses:  Foot injury, left, initial encounter     Discharge Instructions      Advised patient to take medication as directed with food to completion.  Encouraged patient to increase daily water intake while taking this medication.  Advised patient to RICE affected area of left foot for 25 minutes 3 times today and tomorrow, Thursday, 11/15/2021.  Instructed patient to stay off left foot is much as possible for the next 2 days.  Advised patient if foot worsens and/or unresolved please follow-up with [facility] orthopedic provider (contact information provided above with this AVS).     ED Prescriptions     Medication Sig Dispense Auth. Provider   celecoxib (CELEBREX) 100 MG capsule Take 1 capsule (100 mg total) by mouth 2 (two) times daily for 15 days. 30 capsule Trevor Ihaagan, Michal Callicott, FNP      PDMP not reviewed this encounter.   Trevor IhaRagan, Leialoha Hanna, FNP 12/15/21 1001

## 2021-12-15 NOTE — Discharge Instructions (Addendum)
Advised patient to take medication as directed with food to completion.  Encouraged patient to increase daily water intake while taking this medication.  Advised patient to RICE affected area of left foot for 25 minutes 3 times today and tomorrow, Thursday, 11/15/2021.  Instructed patient to stay off left foot is much as possible for the next 2 days.  Advised patient if foot worsens and/or unresolved please follow-up with [facility] orthopedic provider (contact information provided above with this AVS).

## 2021-12-15 NOTE — ED Triage Notes (Signed)
Pt states that he injured his left foot. X4 days

## 2022-01-02 ENCOUNTER — Other Ambulatory Visit: Payer: Self-pay | Admitting: Family Medicine

## 2022-01-21 ENCOUNTER — Ambulatory Visit: Payer: BC Managed Care – PPO | Admitting: Physician Assistant

## 2022-01-24 ENCOUNTER — Encounter: Payer: Self-pay | Admitting: Physician Assistant

## 2022-01-24 ENCOUNTER — Other Ambulatory Visit: Payer: Self-pay

## 2022-01-24 ENCOUNTER — Ambulatory Visit: Payer: BC Managed Care – PPO | Admitting: Physician Assistant

## 2022-01-24 VITALS — BP 152/72 | HR 78 | Ht 67.0 in | Wt 282.0 lb

## 2022-01-24 DIAGNOSIS — Z6841 Body Mass Index (BMI) 40.0 and over, adult: Secondary | ICD-10-CM

## 2022-01-24 DIAGNOSIS — I10 Essential (primary) hypertension: Secondary | ICD-10-CM

## 2022-01-24 DIAGNOSIS — Z8249 Family history of ischemic heart disease and other diseases of the circulatory system: Secondary | ICD-10-CM | POA: Diagnosis not present

## 2022-01-24 MED ORDER — VALSARTAN-HYDROCHLOROTHIAZIDE 320-25 MG PO TABS: 1.0000 | ORAL_TABLET | Freq: Every day | ORAL | 1 refills | Status: DC

## 2022-01-24 NOTE — Progress Notes (Signed)
? ?  Subjective:  ? ? Patient ID: Russell Orr, male    DOB: Jan 12, 1971, 51 y.o.   MRN: [id number] ? ?HPI ?Pt is a 51 yo obese male with HTN, HLD, hypertriglyceridemia who presents to the clinic for 6 month follow up and refills.  ? ?Pt is taking his medication daily for HTN. No CP, palpitations, headaches, or vision changes. He is not exercising or eating right. He does drink 10 alcoholic beverages a week but usually all on the weekend. Pt does snore but denies any apneic events.  ? ?.. ?Family History  ?Problem Relation Age of Onset  ? Bipolar disorder Mother   ?     died ESRD age 9  ? Heart attack Father 70  ? Hypertension Father   ? Hyperlipidemia Father   ? Heart disease Father   ? Obesity Brother   ? ? ? ?Review of Systems  ?All other systems reviewed and are negative. ? ?   ?Objective:  ? Physical Exam ?Vitals reviewed.  ?Constitutional:   ?   Appearance: Normal appearance. He is obese.  ?HENT:  ?   Head: Normocephalic.  ?Cardiovascular:  ?   Rate and Rhythm: Normal rate and regular rhythm.  ?   Pulses: Normal pulses.  ?   Heart sounds: Normal heart sounds.  ?Pulmonary:  ?   Effort: Pulmonary effort is normal.  ?   Breath sounds: Normal breath sounds.  ?Neurological:  ?   General: No focal deficit present.  ?   Mental Status: He is alert and oriented to person, place, and time.  ?Psychiatric:     ?   Mood and Affect: Mood normal.  ? ? ? ? ? ?   ?Assessment & Plan:  ?..Keevon was seen today for hypertension. ? ?Diagnoses and all orders for this visit: ? ?Essential hypertension, benign ?-     COMPLETE METABOLIC PANEL WITH GFR ?-     valsartan-hydrochlorothiazide (DIOVAN-HCT) 320-25 MG tablet; Take 1 tablet by mouth daily. ? ?Family history of premature coronary heart disease ? ?Class 3 severe obesity due to excess calories without serious comorbidity with body mass index (BMI) of 40.0 to 44.9 in adult [facility]) ? ? ?BP elevated today. Pt will start checking at home. He wants to try to make lifestyle changes  before adding norvasc. Continue on diovan/HCT.  ?Cmp ordered today ?Discussed low salt diet, regular exercise, limiting alcohol use to no more than 2 drinks a night. ?Pt is asymptomatic but does have a strong family history for cardiac events.  ?Follow up in 3 months.  ? ?

## 2022-01-24 NOTE — Patient Instructions (Addendum)
Norvasc 2.5mg  daily if need not today  Hypertension, Adult High blood pressure (hypertension) is when the force of blood pumping through the arteries is too strong. The arteries are the blood vessels that carry blood from the heart throughout the body. Hypertension forces the heart to work harder to pump blood and may cause arteries to become narrow or stiff. Untreated or uncontrolled hypertension can cause a heart attack, heart failure, a stroke, kidney disease, and other problems. A blood pressure reading consists of a higher number over a lower number. Ideally, your blood pressure should be below 120/80. The first ("top") number is called the systolic pressure. It is a measure of the pressure in your arteries as your heart beats. The second ("bottom") number is called the diastolic pressure. It is a measure of the pressure in your arteries as the heart relaxes. What are the causes? The exact cause of this condition is not known. There are some conditions that result in or are related to high blood pressure. What increases the risk? Some risk factors for high blood pressure are under your control. The following factors may make you more likely to develop this condition: Smoking. Having type 2 diabetes mellitus, high cholesterol, or both. Not getting enough exercise or physical activity. Being overweight. Having too much fat, sugar, calories, or salt (sodium) in your diet. Drinking too much alcohol. Some risk factors for high blood pressure may be difficult or impossible to change. Some of these factors include: Having chronic kidney disease. Having a family history of high blood pressure. Age. Risk increases with age. Race. You may be at higher risk if you are African American. Gender. Men are at higher risk than women before age 49. After age 49, women are at higher risk than men. Having obstructive sleep apnea. Stress. What are the signs or symptoms? High blood pressure may not cause  symptoms. Very high blood pressure (hypertensive crisis) may cause: Headache. Anxiety. Shortness of breath. Nosebleed. Nausea and vomiting. Vision changes. Severe chest pain. Seizures. How is this diagnosed? This condition is diagnosed by measuring your blood pressure while you are seated, with your arm resting on a flat surface, your legs uncrossed, and your feet flat on the floor. The cuff of the blood pressure monitor will be placed directly against the skin of your upper arm at the level of your heart. It should be measured at least twice using the same arm. Certain conditions can cause a difference in blood pressure between your right and left arms. Certain factors can cause blood pressure readings to be lower or higher than normal for a short period of time: When your blood pressure is higher when you are in a health care provider's office than when you are at home, this is called white coat hypertension. Most people with this condition do not need medicines. When your blood pressure is higher at home than when you are in a health care provider's office, this is called masked hypertension. Most people with this condition may need medicines to control blood pressure. If you have a high blood pressure reading during one visit or you have normal blood pressure with other risk factors, you may be asked to: Return on a different day to have your blood pressure checked again. Monitor your blood pressure at home for 1 week or longer. If you are diagnosed with hypertension, you may have other blood or imaging tests to help your health care provider understand your overall risk for other conditions. How is  this treated? This condition is treated by making healthy lifestyle changes, such as eating healthy foods, exercising more, and reducing your alcohol intake. Your health care provider may prescribe medicine if lifestyle changes are not enough to get your blood pressure under control, and if: Your  systolic blood pressure is above 130. Your diastolic blood pressure is above 80. Your personal target blood pressure may vary depending on your medical conditions, your age, and other factors. Follow these instructions at home: Eating and drinking  Eat a diet that is high in fiber and potassium, and low in sodium, added sugar, and fat. An example eating plan is called the DASH (Dietary Approaches to Stop Hypertension) diet. To eat this way: Eat plenty of fresh fruits and vegetables. Try to fill one half of your plate at each meal with fruits and vegetables. Eat whole grains, such as whole-wheat pasta, brown rice, or whole-grain bread. Fill about one fourth of your plate with whole grains. Eat or drink low-fat dairy products, such as skim milk or low-fat yogurt. Avoid fatty cuts of meat, processed or cured meats, and poultry with skin. Fill about one fourth of your plate with lean proteins, such as fish, chicken without skin, beans, eggs, or tofu. Avoid pre-made and processed foods. These tend to be higher in sodium, added sugar, and fat. Reduce your daily sodium intake. Most people with hypertension should eat less than 1,500 mg of sodium a day. Do not drink alcohol if: Your health care provider tells you not to drink. You are pregnant, may be pregnant, or are planning to become pregnant. If you drink alcohol: Limit how much you use to: 0-1 drink a day for women. 0-2 drinks a day for men. Be aware of how much alcohol is in your drink. In the [place] one drink equals one 12 oz bottle of beer (355 mL), one 5 oz glass of wine (148 mL), or one 1 oz glass of hard liquor (44 mL). Lifestyle  Work with your health care provider to maintain a healthy body weight or to lose weight. Ask what an ideal weight is for you. Get at least 30 minutes of exercise most days of the week. Activities may include walking, swimming, or biking. Include exercise to strengthen your muscles (resistance exercise), such as  Pilates or lifting weights, as part of your weekly exercise routine. Try to do these types of exercises for 30 minutes at least 3 days a week. Do not use any products that contain nicotine or tobacco, such as cigarettes, e-cigarettes, and chewing tobacco. If you need help quitting, ask your health care provider. Monitor your blood pressure at home as told by your health care provider. Keep all follow-up visits as told by your health care provider. This is important. Medicines Take over-the-counter and prescription medicines only as told by your health care provider. Follow directions carefully. Blood pressure medicines must be taken as prescribed. Do not skip doses of blood pressure medicine. Doing this puts you at risk for problems and can make the medicine less effective. Ask your health care provider about side effects or reactions to medicines that you should watch for. Contact a health care provider if you: Think you are having a reaction to a medicine you are taking. Have headaches that keep coming back (recurring). Feel dizzy. Have swelling in your ankles. Have trouble with your vision. Get help right away if you: Develop a severe headache or confusion. Have unusual weakness or numbness. Feel faint. Have severe pain in  your chest or abdomen. Vomit repeatedly. Have trouble breathing. Summary Hypertension is when the force of blood pumping through your arteries is too strong. If this condition is not controlled, it may put you at risk for serious complications. Your personal target blood pressure may vary depending on your medical conditions, your age, and other factors. For most people, a normal blood pressure is less than 120/80. Hypertension is treated with lifestyle changes, medicines, or a combination of both. Lifestyle changes include losing weight, eating a healthy, low-sodium diet, exercising more, and limiting alcohol. This information is not intended to replace advice given to  you by your health care provider. Make sure you discuss any questions you have with your health care provider. Document Revised: 07/11/2018 Document Reviewed: 07/11/2018 Elsevier Patient Education  2022 [employer].

## 2022-01-25 LAB — COMPLETE METABOLIC PANEL WITH GFR
AG Ratio: 1.8 (calc) (ref 1.0–2.5)
ALT: 30 U/L (ref 9–46)
AST: 21 U/L (ref 10–40)
Albumin: 4.8 g/dL (ref 3.6–5.1)
Alkaline phosphatase (APISO): 58 U/L (ref 36–130)
BUN: 15 mg/dL (ref 7–25)
CO2: 28 mmol/L (ref 20–32)
Calcium: 10 mg/dL (ref 8.6–10.3)
Chloride: 100 mmol/L (ref 98–110)
Creat: 1 mg/dL (ref 0.60–1.29)
Globulin: 2.7 g/dL (calc) (ref 1.9–3.7)
Glucose, Bld: 104 mg/dL — ABNORMAL HIGH (ref 65–99)
Potassium: 4.6 mmol/L (ref 3.5–5.3)
Sodium: 139 mmol/L (ref 135–146)
Total Bilirubin: 0.3 mg/dL (ref 0.2–1.2)
Total Protein: 7.5 g/dL (ref 6.1–8.1)
eGFR: 93 mL/min/{1.73_m2} (ref >=60)

## 2022-01-25 NOTE — Progress Notes (Signed)
Kidney and liver look great!!!!

## 2022-02-07 DIAGNOSIS — L814 Other melanin hyperpigmentation: Secondary | ICD-10-CM | POA: Diagnosis not present

## 2022-02-07 DIAGNOSIS — D229 Melanocytic nevi, unspecified: Secondary | ICD-10-CM | POA: Diagnosis not present

## 2022-03-03 DIAGNOSIS — M79671 Pain in right foot: Secondary | ICD-10-CM | POA: Diagnosis not present

## 2022-03-03 DIAGNOSIS — M722 Plantar fascial fibromatosis: Secondary | ICD-10-CM | POA: Diagnosis not present

## 2022-03-04 DIAGNOSIS — M722 Plantar fascial fibromatosis: Secondary | ICD-10-CM | POA: Insufficient documentation

## 2022-04-26 ENCOUNTER — Ambulatory Visit: Payer: BC Managed Care – PPO | Admitting: Physician Assistant

## 2022-04-26 ENCOUNTER — Encounter: Payer: Self-pay | Admitting: Physician Assistant

## 2022-04-26 VITALS — BP 122/69 | HR 73 | Ht 67.0 in | Wt 270.0 lb

## 2022-04-26 DIAGNOSIS — Z1211 Encounter for screening for malignant neoplasm of colon: Secondary | ICD-10-CM | POA: Diagnosis not present

## 2022-04-26 DIAGNOSIS — Z6841 Body Mass Index (BMI) 40.0 and over, adult: Secondary | ICD-10-CM

## 2022-04-26 DIAGNOSIS — I10 Essential (primary) hypertension: Secondary | ICD-10-CM

## 2022-04-26 NOTE — Progress Notes (Signed)
   Established Patient Office Visit  Subjective   Patient ID: Russell Orr, male    DOB: [birth year]  Age: 51 y.o. MRN: [id number]  Chief Complaint  Patient presents with   Hypertension    HPI Pt is a 51 yo obese male with HTN who presents to the clinic for follow up on blood pressure. He has been making lifestyle changes in combination with diovan/HCT to avoid any other medications. He denies any CP, palpitations, headaches, vision changes, or dizziness. He has decreased salt in diet. BP at home have improved and 120-130 over 70-80.   .. Active Ambulatory Problems    Diagnosis Date Noted   Class 3 severe obesity due to excess calories without serious comorbidity with body mass index (BMI) of 40.0 to 44.9 in adult (HCC) 07/27/2010   HYPERTENSION, BENIGN ESSENTIAL 07/12/2007   Low HDL (under 40) 11/22/2012   Hyperlipidemia LDL goal <160 11/23/2012   Obesity (BMI 30-39.9) 08/04/2017   Hypertriglyceridemia 08/04/2017   Family history of premature coronary heart disease 07/07/2021   Plantar fasciitis of right foot 03/04/2022   Resolved Ambulatory Problems    Diagnosis Date Noted   No Resolved Ambulatory Problems   Past Medical History:  Diagnosis Date   Hypertension       Review of Systems  All other systems reviewed and are negative.     Objective:     BP 122/69   Pulse 73   Ht 5\' 7"  (1.702 m)   Wt 270 lb (122.5 kg)   SpO2 98%   BMI 42.29 kg/m  BP Readings from Last 3 Encounters:  04/26/22 122/69  01/24/22 (!) 152/72  12/15/21 (!) 154/92      Physical Exam Vitals reviewed.  Constitutional:      Appearance: Normal appearance. He is obese.  HENT:     Head: Normocephalic.  Neck:     Vascular: No carotid bruit.  Cardiovascular:     Rate and Rhythm: Normal rate and regular rhythm.  Pulmonary:     Effort: Pulmonary effort is normal.     Breath sounds: Normal breath sounds.  Abdominal:     General: Bowel sounds are normal.     Palpations: Abdomen is  soft.  Musculoskeletal:     Cervical back: Normal range of motion and neck supple.     Right lower leg: No edema.     Left lower leg: No edema.  Neurological:     General: No focal deficit present.     Mental Status: He is alert and oriented to person, place, and time.  Psychiatric:        Mood and Affect: Mood normal.        Assessment & Plan:  04/01/23Marland KitchenYousef was seen today for hypertension.  Diagnoses and all orders for this visit:  Essential hypertension, benign  Colon cancer screening -     Cologuard  Class 3 severe obesity due to excess calories without serious comorbidity with body mass index (BMI) of 40.0 to 44.9 in adult (HCC)   BP improved and much better. Continue on diovan/HCT Continue to work on weight loss and getting regular exercise Cologuard order placed for screening  CPE in 3 months.    Return in about 3 months (around 07/27/2022) for Needs CPE.    07/29/2022, PA-C

## 2022-06-10 DIAGNOSIS — Z1211 Encounter for screening for malignant neoplasm of colon: Secondary | ICD-10-CM | POA: Diagnosis not present

## 2022-06-18 LAB — COLOGUARD: COLOGUARD: NEGATIVE

## 2022-06-20 NOTE — Progress Notes (Signed)
GREAT news. Negative cologuard. Re-test in 3 years.

## 2022-07-29 ENCOUNTER — Ambulatory Visit (INDEPENDENT_AMBULATORY_CARE_PROVIDER_SITE_OTHER): Payer: BC Managed Care – PPO | Admitting: Physician Assistant

## 2022-07-29 ENCOUNTER — Encounter: Payer: Self-pay | Admitting: Physician Assistant

## 2022-07-29 VITALS — BP 136/80 | HR 73 | Wt 259.0 lb

## 2022-07-29 DIAGNOSIS — I10 Essential (primary) hypertension: Secondary | ICD-10-CM | POA: Diagnosis not present

## 2022-07-29 DIAGNOSIS — E781 Pure hyperglyceridemia: Secondary | ICD-10-CM

## 2022-07-29 DIAGNOSIS — E785 Hyperlipidemia, unspecified: Secondary | ICD-10-CM | POA: Diagnosis not present

## 2022-07-29 DIAGNOSIS — Z0000 Encounter for general adult medical examination without abnormal findings: Secondary | ICD-10-CM | POA: Diagnosis not present

## 2022-07-29 DIAGNOSIS — Z6841 Body Mass Index (BMI) 40.0 and over, adult: Secondary | ICD-10-CM

## 2022-07-29 DIAGNOSIS — R4589 Other symptoms and signs involving emotional state: Secondary | ICD-10-CM

## 2022-07-29 DIAGNOSIS — Z1329 Encounter for screening for other suspected endocrine disorder: Secondary | ICD-10-CM

## 2022-07-29 DIAGNOSIS — F419 Anxiety disorder, unspecified: Secondary | ICD-10-CM

## 2022-07-29 MED ORDER — VALSARTAN-HYDROCHLOROTHIAZIDE 320-25 MG PO TABS: 1.0000 | ORAL_TABLET | Freq: Every day | ORAL | 1 refills | Status: DC

## 2022-07-29 NOTE — Progress Notes (Signed)
Complete physical exam  Patient: Russell Orr   DOB: 1971/03/10   51 y.o. Male  MRN: [id number]  Subjective:    Chief Complaint  Patient presents with   Annual Exam    Russell Orr is a 51 y.o. male who presents today for a complete physical exam. He reports consuming a general diet. The patient does not participate in regular exercise at present. He generally feels well. He reports sleeping well. He does not have additional problems to discuss today.    Most recent fall risk assessment:    04/26/2022    8:10 AM  Fall Risk   Falls in the past year? 0  Number falls in past yr: 0  Injury with Fall? 0  Risk for fall due to : No Fall Risks  Follow up Falls evaluation completed     Most recent depression screenings:    04/26/2022    8:10 AM 01/24/2022    2:04 PM  PHQ 2/9 Scores  PHQ - 2 Score 1 0    Vision:Within last year and Dental: No current dental problems  Patient Active Problem List   Diagnosis Date Noted   Anxiety 07/29/2022   Depressed mood 07/29/2022   Plantar fasciitis of right foot 03/04/2022   Family history of premature coronary heart disease 07/07/2021   Obesity (BMI 30-39.9) 08/04/2017   Hypertriglyceridemia 08/04/2017   Hyperlipidemia LDL goal <160 11/23/2012   Low HDL (under 40) 11/22/2012   Class 3 severe obesity due to excess calories without serious comorbidity with body mass index (BMI) of 40.0 to 44.9 in adult (HCC) 07/27/2010   HYPERTENSION, BENIGN ESSENTIAL 07/12/2007   Past Medical History:  Diagnosis Date   Hypertension    Family History  Problem Relation Age of Onset   Bipolar disorder Mother        died ESRD age 50   Heart attack Father 62   Hypertension Father    Hyperlipidemia Father    Heart disease Father    Obesity Brother    No Known Allergies    Patient Care Team: Nolene Ebbs as PCP - General (Family Medicine)   Outpatient Medications Prior to Visit  Medication Sig   albuterol (PROVENTIL  HFA;VENTOLIN HFA) 108 (90 Base) MCG/ACT inhaler Inhale 2 puffs into the lungs every 6 (six) hours as needed for wheezing or shortness of breath. (Patient not taking: Reported on 01/24/2022)   aspirin 81 MG chewable tablet Chew by mouth daily.   Multiple Vitamin (MULTIVITAMIN) tablet Take 1 tablet by mouth daily.   [DISCONTINUED] valsartan-hydrochlorothiazide (DIOVAN-HCT) 320-25 MG tablet Take 1 tablet by mouth daily.   No facility-administered medications prior to visit.    Review of Systems  All other systems reviewed and are negative.         Objective:     BP 136/80   Pulse 73   Wt 259 lb (117.5 kg)   SpO2 100%   BMI 40.57 kg/m  BP Readings from Last 3 Encounters:  07/29/22 136/80  04/26/22 122/69  01/24/22 (!) 152/72   Wt Readings from Last 3 Encounters:  07/29/22 259 lb (117.5 kg)  04/26/22 270 lb (122.5 kg)  01/24/22 282 lb (127.9 kg)      Physical Exam  BP 136/80   Pulse 73   Wt 259 lb (117.5 kg)   SpO2 100%   BMI 40.57 kg/m   General Appearance:    Alert, cooperative, no distress, appears stated age  Head:  Normocephalic, without obvious abnormality, atraumatic  Eyes:    PERRL, conjunctiva/corneas clear, EOM's intact, fundi    benign, both eyes       Ears:    Normal TM's and external ear canals, both ears  Nose:   Nares normal, septum midline, mucosa normal, no drainage    or sinus tenderness  Throat:   Lips, mucosa, and tongue normal; teeth and gums normal  Neck:   Supple, symmetrical, trachea midline, no adenopathy;       thyroid:  No enlargement/tenderness/nodules; no carotid   bruit or JVD  Back:     Symmetric, no curvature, ROM normal, no CVA tenderness  Lungs:     Clear to auscultation bilaterally, respirations unlabored  Chest wall:    No tenderness or deformity  Heart:    Regular rate and rhythm, S1 and S2 normal, no murmur, rub   or gallop  Abdomen:     Soft, non-tender, bowel sounds active all four quadrants,    no masses, no organomegaly         Extremities:   Extremities normal, atraumatic, no cyanosis or edema  Pulses:   2+ and symmetric all extremities  Skin:   Skin color, texture, turgor normal, no rashes or lesions  Lymph nodes:   Cervical, supraclavicular, and axillary nodes normal  Neurologic:   CNII-XII intact. Normal strength, sensation and reflexes      throughout    Assessment & Plan:    Routine Health Maintenance and Physical Exam  Immunization History  Administered Date(s) Administered   Influenza Split 09/20/2011   Influenza,inj,Quad PF,6+ Mos 10/10/2016, 08/04/2017   Influenza-Unspecified 08/14/2014, 08/15/2015   PFIZER(Purple Top)SARS-COV-2 Vaccination 01/25/2020, 02/17/2020, 10/03/2020   Tdap 03/04/2020    Health Maintenance  Topic Date Due   COVID-19 Vaccine (4 - Pfizer risk series) 08/14/2022 (Originally 11/28/2020)   INFLUENZA VACCINE  02/12/2023 (Originally 06/14/2022)   Fecal DNA (Cologuard)  06/10/2025   TETANUS/TDAP  03/04/2030   Hepatitis C Screening  Completed   HIV Screening  Completed   HPV VACCINES  Aged Out    Discussed health benefits of physical activity, and encouraged him to engage in regular exercise appropriate for his age and condition. Russell Orr was seen today for annual exam.  Diagnoses and all orders for this visit:  Routine physical examination -     TSH -     Lipid Panel w/reflex Direct LDL -     COMPLETE METABOLIC PANEL WITH GFR -     CBC with Differential/Platelet -     Testosterone Total,Free,Bio, Males -     PSA  Essential hypertension, benign -     COMPLETE METABOLIC PANEL WITH GFR -     valsartan-hydrochlorothiazide (DIOVAN-HCT) 320-25 MG tablet; Take 1 tablet by mouth daily.  Hyperlipidemia LDL goal <160 -     Lipid Panel w/reflex Direct LDL  Hypertriglyceridemia -     Lipid Panel w/reflex Direct LDL  Thyroid disorder screen -     TSH  Anxiety -     Testosterone Total,Free,Bio, Males  Depressed mood -     Testosterone Total,Free,Bio,  Males  Class 3 severe obesity due to excess calories without serious comorbidity with body mass index (BMI) of 40.0 to 44.9 in adult [facility])   ..Start a regular exercise program and make sure you are eating a healthy diet Try to eat 4 servings of dairy a day or take a calcium supplement (500mg  twice a day). Declined flu shot Will get covid booster at  work PHQ and GAD numbers are elevated but patient does not want to consider counseling or medication at this time Fasting labs ordered Cologuard UTD Will check testosterone  BP to goal  ..Discussed low carb diet with 1500 calories and 80g of protein.  Exercising at least 150 minutes a week.  My Fitness Pal could be a Chief Technology Officer.   Follow up in 6 months   Return in about 6 months (around 01/27/2023).     Tandy Gaw, PA-C

## 2022-07-29 NOTE — Patient Instructions (Signed)
Health Maintenance, Male Adopting a healthy lifestyle and getting preventive care are important in promoting health and wellness. Ask your health care provider about: The right schedule for you to have regular tests and exams. Things you can do on your own to prevent diseases and keep yourself healthy. What should I know about diet, weight, and exercise? Eat a healthy diet  Eat a diet that includes plenty of vegetables, fruits, low-fat dairy products, and lean protein. Do not eat a lot of foods that are high in solid fats, added sugars, or sodium. Maintain a healthy weight Body mass index (BMI) is a measurement that can be used to identify possible weight problems. It estimates body fat based on height and weight. Your health care provider can help determine your BMI and help you achieve or maintain a healthy weight. Get regular exercise Get regular exercise. This is one of the most important things you can do for your health. Most adults should: Exercise for at least 150 minutes each week. The exercise should increase your heart rate and make you sweat (moderate-intensity exercise). Do strengthening exercises at least twice a week. This is in addition to the moderate-intensity exercise. Spend less time sitting. Even light physical activity can be beneficial. Watch cholesterol and blood lipids Have your blood tested for lipids and cholesterol at 49 years of age, then have this test every 5 years. You may need to have your cholesterol levels checked more often if: Your lipid or cholesterol levels are high. You are older than 49 years of age. You are at high risk for heart disease. What should I know about cancer screening? Many types of cancers can be detected early and may often be prevented. Depending on your health history and family history, you may need to have cancer screening at various ages. This may include screening for: Colorectal cancer. Prostate cancer. Skin cancer. Lung  cancer. What should I know about heart disease, diabetes, and high blood pressure? Blood pressure and heart disease High blood pressure causes heart disease and increases the risk of stroke. This is more likely to develop in people who have high blood pressure readings or are overweight. Talk with your health care provider about your target blood pressure readings. Have your blood pressure checked: Every 3-5 years if you are 18-39 years of age. Every year if you are 40 years old or older. If you are between the ages of 65 and 75 and are a current or former smoker, ask your health care provider if you should have a one-time screening for abdominal aortic aneurysm (AAA). Diabetes Have regular diabetes screenings. This checks your fasting blood sugar level. Have the screening done: Once every three years after age 45 if you are at a normal weight and have a low risk for diabetes. More often and at a younger age if you are overweight or have a high risk for diabetes. What should I know about preventing infection? Hepatitis B If you have a higher risk for hepatitis B, you should be screened for this virus. Talk with your health care provider to find out if you are at risk for hepatitis B infection. Hepatitis C Blood testing is recommended for: Everyone born from 1945 through 1965. Anyone with known risk factors for hepatitis C. Sexually transmitted infections (STIs) You should be screened each year for STIs, including gonorrhea and chlamydia, if: You are sexually active and are younger than 49 years of age. You are older than 49 years of age and your   health care provider tells you that you are at risk for this type of infection. Your sexual activity has changed since you were last screened, and you are at increased risk for chlamydia or gonorrhea. Ask your health care provider if you are at risk. Ask your health care provider about whether you are at high risk for HIV. Your health care provider  may recommend a prescription medicine to help prevent HIV infection. If you choose to take medicine to prevent HIV, you should first get tested for HIV. You should then be tested every 3 months for as long as you are taking the medicine. Follow these instructions at home: Alcohol use Do not drink alcohol if your health care provider tells you not to drink. If you drink alcohol: Limit how much you have to 0-2 drinks a day. Know how much alcohol is in your drink. In the [place] one drink equals one 12 oz bottle of beer (355 mL), one 5 oz glass of wine (148 mL), or one 1 oz glass of hard liquor (44 mL). Lifestyle Do not use any products that contain nicotine or tobacco. These products include cigarettes, chewing tobacco, and vaping devices, such as e-cigarettes. If you need help quitting, ask your health care provider. Do not use street drugs. Do not share needles. Ask your health care provider for help if you need support or information about quitting drugs. General instructions Schedule regular health, dental, and eye exams. Stay current with your vaccines. Tell your health care provider if: You often feel depressed. You have ever been abused or do not feel safe at home. Summary Adopting a healthy lifestyle and getting preventive care are important in promoting health and wellness. Follow your health care provider's instructions about healthy diet, exercising, and getting tested or screened for diseases. Follow your health care provider's instructions on monitoring your cholesterol and blood pressure. This information is not intended to replace advice given to you by your health care provider. Make sure you discuss any questions you have with your health care provider. Document Revised: 03/22/2021 Document Reviewed: 03/22/2021 Elsevier Patient Education  2023 Elsevier Inc.  

## 2022-08-08 DIAGNOSIS — F419 Anxiety disorder, unspecified: Secondary | ICD-10-CM | POA: Diagnosis not present

## 2022-08-08 DIAGNOSIS — R4589 Other symptoms and signs involving emotional state: Secondary | ICD-10-CM | POA: Diagnosis not present

## 2022-08-08 DIAGNOSIS — Z0000 Encounter for general adult medical examination without abnormal findings: Secondary | ICD-10-CM | POA: Diagnosis not present

## 2022-08-08 DIAGNOSIS — R5383 Other fatigue: Secondary | ICD-10-CM | POA: Diagnosis not present

## 2022-08-08 LAB — CBC WITH DIFFERENTIAL/PLATELET
Eosinophils Relative: 2 %
Hemoglobin: 14.3 g/dL (ref 13.2–17.1)
MCH: 27.5 pg (ref 27.0–33.0)
MCHC: 33.3 g/dL (ref 32.0–36.0)
MPV: 12 fL (ref 7.5–12.5)
Total Lymphocyte: 35.9 %

## 2022-08-09 LAB — TESTOSTERONE TOTAL,FREE,BIO, MALES
Albumin: 4.4 g/dL (ref 3.6–5.1)
Sex Hormone Binding: 38 nmol/L (ref 10–50)
Testosterone, Bioavailable: 111.6 ng/dL (ref 110.0–575.0)
Testosterone, Free: 55.4 pg/mL (ref 46.0–224.0)
Testosterone: 465 ng/dL (ref 250–827)

## 2022-08-09 LAB — PSA: PSA: 1.44 ng/mL (ref <=4.00)

## 2022-08-09 LAB — COMPLETE METABOLIC PANEL WITH GFR
Alkaline phosphatase (APISO): 52 U/L (ref 36–130)
BUN: 16 mg/dL (ref 7–25)
CO2: 25 mmol/L (ref 20–32)
Calcium: 9.6 mg/dL (ref 8.6–10.3)
Chloride: 104 mmol/L (ref 98–110)
Creat: 0.8 mg/dL (ref 0.60–1.29)
Globulin: 2.9 g/dL (calc) (ref 1.9–3.7)
Potassium: 4.2 mmol/L (ref 3.5–5.3)
Total Protein: 7.2 g/dL (ref 6.1–8.1)
eGFR: 108 mL/min/{1.73_m2} (ref >=60)

## 2022-08-09 LAB — CBC WITH DIFFERENTIAL/PLATELET
Absolute Monocytes: 346 cells/uL (ref 200–950)
Basophils Absolute: 38 cells/uL (ref 0–200)
Basophils Relative: 0.6 %
Eosinophils Absolute: 128 cells/uL (ref 15–500)
HCT: 43 % (ref 38.5–50.0)
Neutrophils Relative %: 56.1 %
WBC: 6.4 10*3/uL (ref 3.8–10.8)

## 2022-08-09 NOTE — Progress Notes (Signed)
Russell Orr,   PSA normal.  Testosterone looks good.  Hemoglobin looks good.  Thyroid looks great.  Kidney, liver look good.  Glucose a little elevated. Will add A1C.   Cholesterol has improved some! TG still elevated. Overall 10 year risk is still low. Continue to make diet changes. Start OTC Fish Oil.   Marland Kitchen.The 10-year ASCVD risk score (Arnett DK, et al., 2019) is: 4.5%   Values used to calculate the score:     Age: 51 years     Sex: Male     Is Non-Hispanic African American: No     Diabetic: No     Tobacco smoker: No     Systolic Blood Pressure: 599 mmHg     Is BP treated: Yes     HDL Cholesterol: 46 mg/dL     Total Cholesterol: 200 mg/dL

## 2022-08-10 ENCOUNTER — Encounter: Payer: Self-pay | Admitting: Physician Assistant

## 2022-08-10 DIAGNOSIS — R7303 Prediabetes: Secondary | ICD-10-CM | POA: Insufficient documentation

## 2022-08-10 LAB — CBC WITH DIFFERENTIAL/PLATELET
Lymphs Abs: 2298 cells/uL (ref 850–3900)
MCV: 82.7 fL (ref 80.0–100.0)
Monocytes Relative: 5.4 %
Neutro Abs: 3590 cells/uL (ref 1500–7800)
Platelets: 258 10*3/uL (ref 140–400)
RBC: 5.2 10*6/uL (ref 4.20–5.80)
RDW: 14.3 % (ref 11.0–15.0)

## 2022-08-10 LAB — LIPID PANEL W/REFLEX DIRECT LDL
Cholesterol: 200 mg/dL — ABNORMAL HIGH (ref <200)
HDL: 46 mg/dL (ref >=40)
LDL Cholesterol (Calc): 122 mg/dL (calc) — ABNORMAL HIGH
Non-HDL Cholesterol (Calc): 154 mg/dL (calc) — ABNORMAL HIGH (ref <130)
Total CHOL/HDL Ratio: 4.3 (calc) (ref <5.0)
Triglycerides: 201 mg/dL — ABNORMAL HIGH (ref <150)

## 2022-08-10 LAB — TSH: TSH: 1.66 mIU/L (ref 0.40–4.50)

## 2022-08-10 LAB — COMPLETE METABOLIC PANEL WITH GFR
AG Ratio: 1.5 (calc) (ref 1.0–2.5)
ALT: 24 U/L (ref 9–46)
AST: 16 U/L (ref 10–40)
Albumin: 4.3 g/dL (ref 3.6–5.1)
Glucose, Bld: 109 mg/dL — ABNORMAL HIGH (ref 65–99)
Sodium: 139 mmol/L (ref 135–146)
Total Bilirubin: 0.3 mg/dL (ref 0.2–1.2)

## 2022-08-10 LAB — HEMOGLOBIN A1C W/OUT EAG: Hgb A1c MFr Bld: 6 % of total Hgb — ABNORMAL HIGH (ref <5.7)

## 2022-08-10 NOTE — Progress Notes (Signed)
A1c is 6 which is pre-diabetes. Recheck in 6 months. Start now watching carbs and sugars and getting regular exercise.

## 2022-10-04 ENCOUNTER — Telehealth: Payer: Self-pay | Admitting: Neurology

## 2022-10-04 NOTE — Telephone Encounter (Signed)
Patient's wife Huntley Dec left a vm stating patient's labs weren't covered with diagnosis code provided and asked [place] to re-submit. Marylene Land - can you do this? Labs from September.

## 2022-10-18 NOTE — Telephone Encounter (Signed)
The bill was $10 and the patient paid and the bill has been closed.

## 2023-01-27 ENCOUNTER — Encounter: Payer: Self-pay | Admitting: Physician Assistant

## 2023-01-27 ENCOUNTER — Ambulatory Visit: Payer: BC Managed Care – PPO | Admitting: Physician Assistant

## 2023-01-27 ENCOUNTER — Other Ambulatory Visit: Payer: Self-pay | Admitting: Physician Assistant

## 2023-01-27 VITALS — BP 130/79 | HR 74 | Ht 67.0 in | Wt 258.0 lb

## 2023-01-27 DIAGNOSIS — R7303 Prediabetes: Secondary | ICD-10-CM

## 2023-01-27 DIAGNOSIS — I10 Essential (primary) hypertension: Secondary | ICD-10-CM | POA: Diagnosis not present

## 2023-01-27 DIAGNOSIS — R221 Localized swelling, mass and lump, neck: Secondary | ICD-10-CM

## 2023-01-27 DIAGNOSIS — R7301 Impaired fasting glucose: Secondary | ICD-10-CM | POA: Diagnosis not present

## 2023-01-27 DIAGNOSIS — Z6841 Body Mass Index (BMI) 40.0 and over, adult: Secondary | ICD-10-CM

## 2023-01-27 MED ORDER — VALSARTAN-HYDROCHLOROTHIAZIDE 320-25 MG PO TABS: 1.0000 | ORAL_TABLET | Freq: Every day | ORAL | 1 refills | Status: DC

## 2023-01-27 NOTE — Progress Notes (Signed)
Established Patient Office Visit  Subjective   Patient ID: Russell Orr, male    DOB: 1971-02-16  Age: 52 y.o. MRN: [id number]  Chief Complaint  Patient presents with   Follow-up    HPI Pt is a 52 yo obese male with HTN and pre-diabetes who presents to the clinic for follow up.   He is doing well. He is active at work but not exercising. He denies any CP, palpitations, headaches, of vision changes. He is sleeping well. Mood is good. He is compliant with medications. He does have a right neck mass for years, since childhood. It is getting bigger and he wants it looked at. No fever, chills, night sweats, unintentional weight loss.    Active Ambulatory Problems    Diagnosis Date Noted   Class 3 severe obesity due to excess calories without serious comorbidity with body mass index (BMI) of 40.0 to 44.9 in adult ([place]) 07/27/2010   HYPERTENSION, BENIGN ESSENTIAL 07/12/2007   Low HDL (under 40) 11/22/2012   Hyperlipidemia LDL goal <160 11/23/2012   Obesity (BMI 30-39.9) 08/04/2017   Hypertriglyceridemia 08/04/2017   Family history of premature coronary heart disease 07/07/2021   Plantar fasciitis of right foot 03/04/2022   Anxiety 07/29/2022   Depressed mood 07/29/2022   Pre-diabetes 08/10/2022   Elevated fasting glucose 01/27/2023   Resolved Ambulatory Problems    Diagnosis Date Noted   No Resolved Ambulatory Problems   Past Medical History:  Diagnosis Date   Hypertension      ROS   See HPI.  Objective:     BP 130/79   Pulse 74   Ht 5\' 7"  (1.702 m)   Wt 258 lb (117 kg)   SpO2 98%   BMI 40.41 kg/m  BP Readings from Last 3 Encounters:  01/27/23 130/79  07/29/22 136/80  04/26/22 122/69   Wt Readings from Last 3 Encounters:  01/27/23 258 lb (117 kg)  07/29/22 259 lb (117.5 kg)  04/26/22 270 lb (122.5 kg)      Physical Exam Constitutional:      Appearance: Normal appearance. He is obese.  HENT:     Head: Normocephalic.  Neck:     Vascular: No carotid  bruit.     Comments: 3cm by 3cm soft mobile non tender neck mass Cardiovascular:     Rate and Rhythm: Normal rate and regular rhythm.     Pulses: Normal pulses.  Pulmonary:     Effort: Pulmonary effort is normal.     Breath sounds: Normal breath sounds.  Musculoskeletal:     Cervical back: No tenderness.  Lymphadenopathy:     Cervical: No cervical adenopathy.  Neurological:     General: No focal deficit present.     Mental Status: He is alert and oriented to person, place, and time.  Psychiatric:        Mood and Affect: Mood normal.       The 10-year ASCVD risk score (Arnett DK, et al., 2019) is: 4.1%    Assessment & Plan:  Marland KitchenMarland KitchenDimitar was seen today for follow-up.  Diagnoses and all orders for this visit:  Essential hypertension, benign -     COMPLETE METABOLIC PANEL WITH GFR -     valsartan-hydrochlorothiazide (DIOVAN-HCT) 320-25 MG tablet; Take 1 tablet by mouth daily.  Elevated fasting glucose -     Hemoglobin A1c  Pre-diabetes -     Hemoglobin A1c  Mass of right side of neck -     TSH -  CBC w/Diff/Platelet -     US SOFT TISSUE HEAD & NECK (NON-THYROID); Future  Class 3 severe obesity due to excess calories without serious comorbidity with body mass index (BMI) of 40.0 to 44.9 in adult (HCC)   BP looks good Cmp ordered Diovan/HCT refilled A1C ordered to follow up on pre-diabetes US soft tissue ordered for neck mass suspect lipoma but is changing and never had evaluated.  Will follow up after u/s TSH/CBC ordered for follow up .Marland KitchenDiscussed low carb diet with 1500 calories and 80g of protein.  Exercising at least 150 minutes a week.  My Fitness Pal could be a Microbiologist.      Return in about 6 months (around 07/30/2023).    Iran Planas, PA-C

## 2023-01-28 LAB — COMPLETE METABOLIC PANEL WITH GFR
AG Ratio: 1.5 (calc) (ref 1.0–2.5)
ALT: 29 U/L (ref 9–46)
AST: 19 U/L (ref 10–40)
Albumin: 4.5 g/dL (ref 3.6–5.1)
Alkaline phosphatase (APISO): 55 U/L (ref 36–130)
BUN: 15 mg/dL (ref 7–25)
CO2: 26 mmol/L (ref 20–32)
Calcium: 9.3 mg/dL (ref 8.6–10.3)
Chloride: 103 mmol/L (ref 98–110)
Creat: 0.92 mg/dL (ref 0.60–1.29)
Globulin: 3 g/dL (calc) (ref 1.9–3.7)
Glucose, Bld: 105 mg/dL — ABNORMAL HIGH (ref 65–99)
Potassium: 4.8 mmol/L (ref 3.5–5.3)
Sodium: 138 mmol/L (ref 135–146)
Total Bilirubin: 0.4 mg/dL (ref 0.2–1.2)
Total Protein: 7.5 g/dL (ref 6.1–8.1)
eGFR: 102 mL/min/{1.73_m2} (ref >=60)

## 2023-01-28 LAB — CBC WITH DIFFERENTIAL/PLATELET
Absolute Monocytes: 389 cells/uL (ref 200–950)
Basophils Absolute: 29 cells/uL (ref 0–200)
Basophils Relative: 0.5 %
Eosinophils Absolute: 99 cells/uL (ref 15–500)
Eosinophils Relative: 1.7 %
HCT: 44.4 % (ref 38.5–50.0)
Hemoglobin: 14.5 g/dL (ref 13.2–17.1)
Lymphs Abs: 2297 cells/uL (ref 850–3900)
MCH: 27.2 pg (ref 27.0–33.0)
MCHC: 32.7 g/dL (ref 32.0–36.0)
MCV: 83.3 fL (ref 80.0–100.0)
MPV: 11.8 fL (ref 7.5–12.5)
Monocytes Relative: 6.7 %
Neutro Abs: 2987 cells/uL (ref 1500–7800)
Neutrophils Relative %: 51.5 %
Platelets: 267 10*3/uL (ref 140–400)
RBC: 5.33 10*6/uL (ref 4.20–5.80)
RDW: 13.9 % (ref 11.0–15.0)
Total Lymphocyte: 39.6 %
WBC: 5.8 10*3/uL (ref 3.8–10.8)

## 2023-01-28 LAB — HEMOGLOBIN A1C
Hgb A1c MFr Bld: 6.3 % of total Hgb — ABNORMAL HIGH (ref <5.7)
Mean Plasma Glucose: 134 mg/dL
eAG (mmol/L): 7.4 mmol/L

## 2023-01-28 LAB — TSH: TSH: 1.18 mIU/L (ref 0.40–4.50)

## 2023-01-30 ENCOUNTER — Encounter: Payer: Self-pay | Admitting: Physician Assistant

## 2023-01-30 ENCOUNTER — Ambulatory Visit (INDEPENDENT_AMBULATORY_CARE_PROVIDER_SITE_OTHER): Payer: BC Managed Care – PPO

## 2023-01-30 DIAGNOSIS — R221 Localized swelling, mass and lump, neck: Secondary | ICD-10-CM | POA: Diagnosis not present

## 2023-01-30 DIAGNOSIS — D179 Benign lipomatous neoplasm, unspecified: Secondary | ICD-10-CM | POA: Insufficient documentation

## 2023-01-30 MED ORDER — METFORMIN HCL 500 MG PO TABS: 500.0000 mg | ORAL_TABLET | Freq: Two times a day (BID) | ORAL | 1 refills | Status: DC

## 2023-01-30 NOTE — Progress Notes (Signed)
Ultrasound confirmed lipoma(fatty mass). Not concerning but if you would like to consider removal we could make referral.

## 2023-01-30 NOTE — Progress Notes (Signed)
Russell Orr,   Kidney and liver look great.  Fasting glucose is up some.  A1C is 6.3 and closer to diabetes. Diabetes range starts at 6.5.  Would you like to start metformin to help with insulin resistance. Continue with limiting sugars and carbs and getting regular exercise.  Recheck A1c in 6 months.  Thyroid looks good.

## 2023-01-30 NOTE — Addendum Note (Signed)
Addended by: Donella Stade on: 01/30/2023 02:30 PM   Modules accepted: Orders

## 2023-02-01 ENCOUNTER — Encounter: Payer: Self-pay | Admitting: Physician Assistant

## 2023-06-13 IMAGING — DX DG FOOT COMPLETE 3+V*L*
3 series · 3 of 3 positions shown · non-contrast
Comparison: None.

CLINICAL DATA: Left foot injury.

EXAM:
LEFT FOOT - COMPLETE 3+ VIEW

[foot ap]
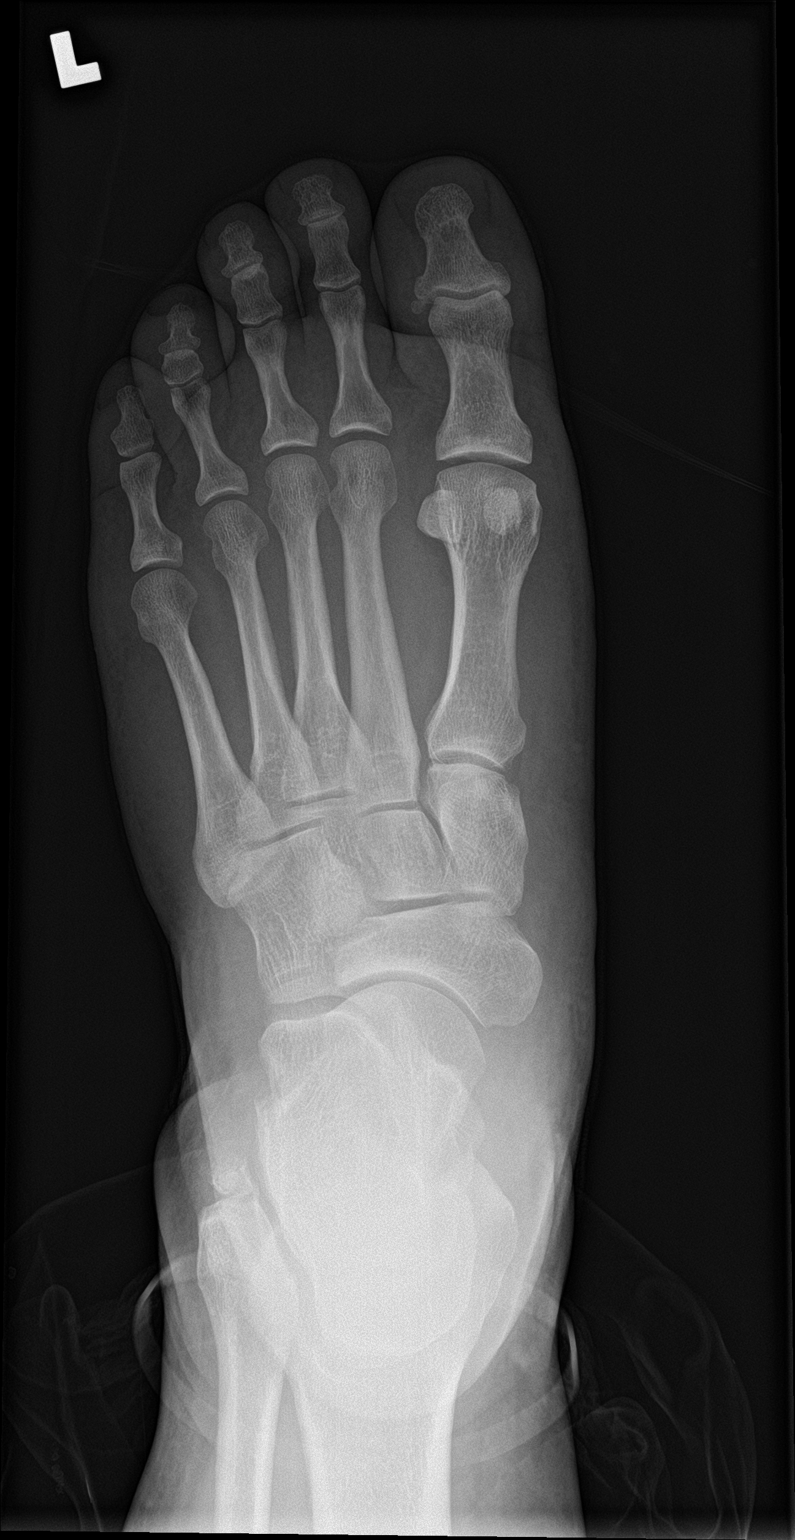

[foot obl]
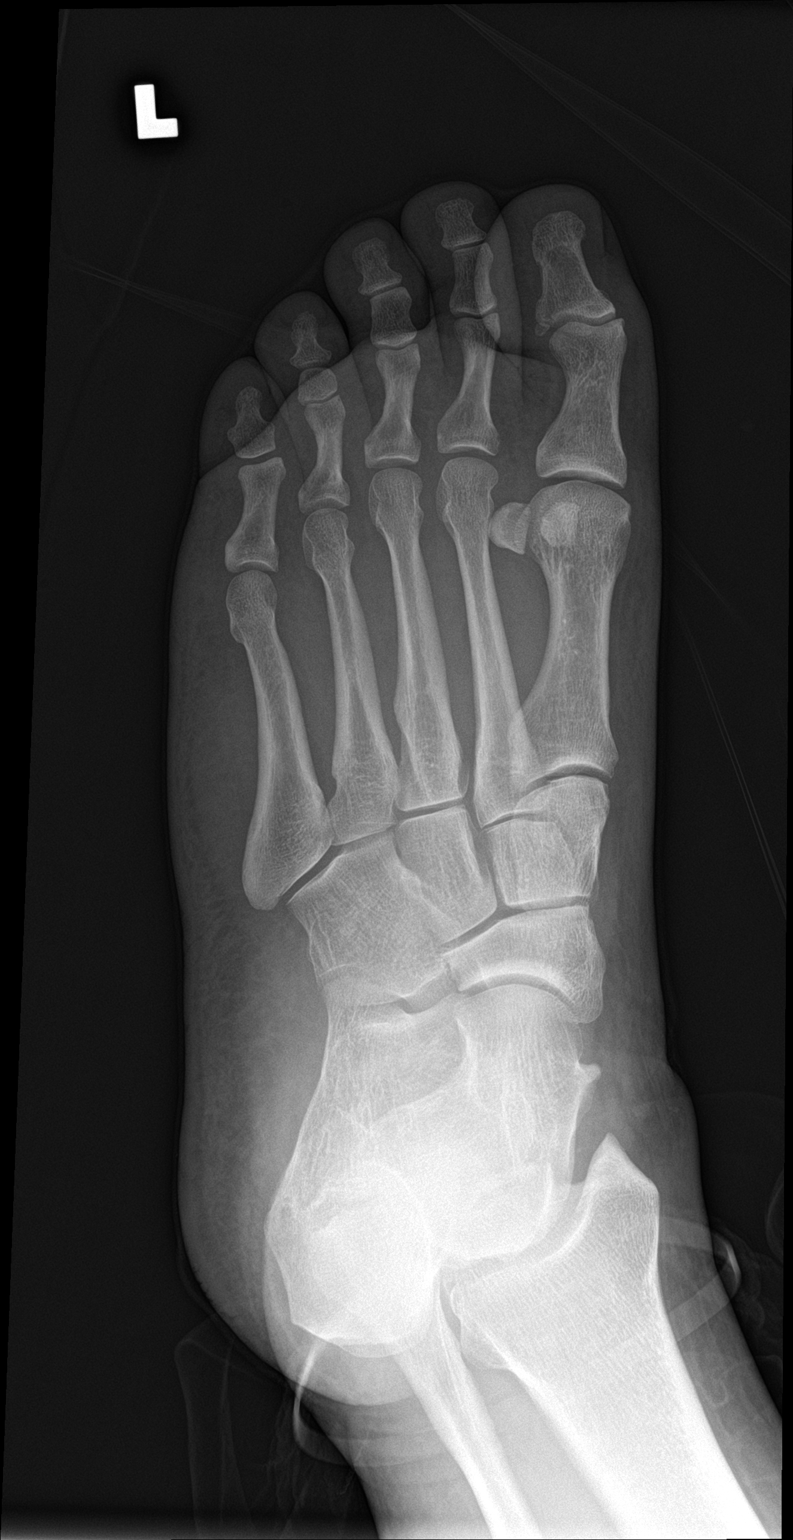

[foot lat]
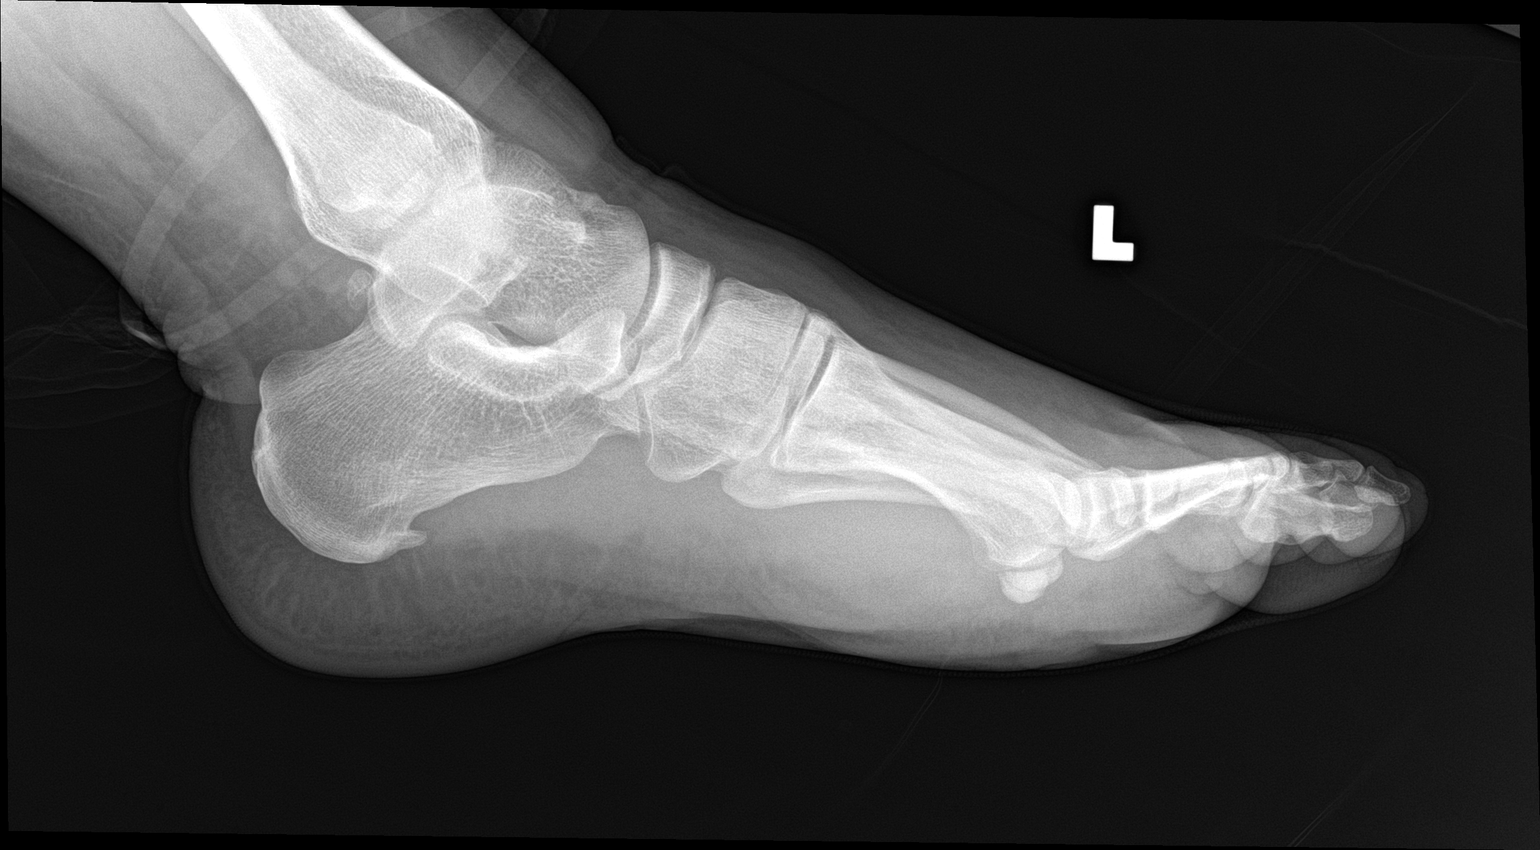

[3 of 3 positions shown; findings below may reference images not displayed]

FINDINGS: Suspected mild diffuse soft tissue swelling about the forefoot.
Peripherally corticated ossicle adjacent to the IP joint of the
great toe likely represents an accessory ossicle. No fracture or
dislocation. Joint spaces are preserved. No erosions. No significant
hallux valgus deformity. Small plantar calcaneal spur. Note is made
of a tiny os trigonum. No radiopaque foreign body.
IMPRESSION: Suspected mild diffuse soft tissue swelling about the forefoot
without associated fracture or radiopaque foreign body.

## 2023-07-26 ENCOUNTER — Other Ambulatory Visit: Payer: Self-pay | Admitting: Physician Assistant

## 2023-07-26 DIAGNOSIS — I10 Essential (primary) hypertension: Secondary | ICD-10-CM

## 2023-07-28 ENCOUNTER — Telehealth (INDEPENDENT_AMBULATORY_CARE_PROVIDER_SITE_OTHER): Payer: BC Managed Care – PPO | Admitting: Sports Medicine

## 2023-07-28 ENCOUNTER — Encounter: Payer: Self-pay | Admitting: Sports Medicine

## 2023-07-28 DIAGNOSIS — U071 COVID-19: Secondary | ICD-10-CM

## 2023-07-28 DIAGNOSIS — I10 Essential (primary) hypertension: Secondary | ICD-10-CM

## 2023-07-28 MED ORDER — NIRMATRELVIR/RITONAVIR (PAXLOVID)TABLET: ORAL_TABLET | ORAL | 0 refills | Status: DC

## 2023-07-28 MED ORDER — VALSARTAN-HYDROCHLOROTHIAZIDE 320-25 MG PO TABS: 1.0000 | ORAL_TABLET | Freq: Every day | ORAL | 0 refills | Status: DC

## 2023-07-28 NOTE — Progress Notes (Signed)
   Virtual Visit via WebEx/MyChart   I connected with  Russell Orr  on 07/28/23 via WebEx/MyChart/Doximity Video and verified that I am speaking with the correct person using two identifiers.   I discussed the limitations, risks, security and privacy concerns of performing an evaluation and management service by WebEx/MyChart/Doximity Video, including the higher likelihood of inaccurate diagnosis and treatment, and the availability of in person appointments.  We also discussed the likely need of an additional face to face encounter for complete and high quality delivery of care.  I also discussed with the patient that there may be a patient responsible charge related to this service. The patient expressed understanding and wishes to proceed.  Provider location is in medical facility. Patient location is at their home, different from provider location. People involved in care of the patient during this telehealth encounter were myself, my nurse/medical assistant, and my front office/scheduling team member.  Review of Systems: No fevers, chills, night sweats, weight loss, chest pain, or shortness of breath.   Objective Findings:    General: Speaking full sentences, no audible heavy breathing.  Sounds alert and appropriately interactive.  Appears well.  Face symmetric.  Extraocular movements intact.  Pupils equal and round.  No nasal flaring or accessory muscle use visualized.  Independent interpretation of tests performed by another provider:   None.  Brief History, Exam, Impression, and Recommendations:    COVID-19 Pleasant 52 year old male, he has a history of obesity, prediabetes, last A1c was 6.3%, hypertension, hyperlipidemia, he does have a strong history of family cardiovascular disease. Had some achiness, fatigue, headaches, ultimately symptoms started on Monday, he tested himself recently and came out positive for COVID. He appears clinically stable on the video visit, no nasal  flaring, accessory muscle use, using full sentences, with his risk factors we will add Paxlovid, he will do other over-the-counter symptomatic care and he can return to see me as needed.   I discussed the above assessment and treatment plan with the patient. The patient was provided an opportunity to ask questions and all were answered. The patient agreed with the plan and demonstrated an understanding of the instructions.   The patient was advised to call back or seek an in-person evaluation if the symptoms worsen or if the condition fails to improve as anticipated.   I provided 15 minutes of face to face and non-face-to-face time during this encounter date, time was needed to gather information, review chart, records, communicate/coordinate with staff remotely, as well as complete documentation.  Today we managed an acute illness with systemic symptoms and provided pharmacologic intervention.  ____________________________________________ Russell Orr. Benjamin Stain, M.D., ABFM., CAQSM., AME. Primary Care and Sports Medicine [facility]  Adjunct Professor of Family Medicine  [employer] [facility]  Restaurant manager, fast food

## 2023-07-28 NOTE — Assessment & Plan Note (Signed)
Pleasant 52 year old male, he has a history of obesity, prediabetes, last A1c was 6.3%, hypertension, hyperlipidemia, he does have a strong history of family cardiovascular disease. Had some achiness, fatigue, headaches, ultimately symptoms started on Monday, he tested himself recently and came out positive for COVID. He appears clinically stable on the video visit, no nasal flaring, accessory muscle use, using full sentences, with his risk factors we will add Paxlovid, he will do other over-the-counter symptomatic care and he can return to see me as needed.

## 2023-07-31 ENCOUNTER — Ambulatory Visit: Payer: BC Managed Care – PPO | Admitting: Physician Assistant

## 2023-08-07 ENCOUNTER — Encounter: Payer: Self-pay | Admitting: Physician Assistant

## 2023-08-07 ENCOUNTER — Ambulatory Visit: Payer: BC Managed Care – PPO | Admitting: Physician Assistant

## 2023-08-07 VITALS — BP 129/71 | HR 98 | Ht 67.0 in | Wt 244.0 lb

## 2023-08-07 DIAGNOSIS — Z6838 Body mass index (BMI) 38.0-38.9, adult: Secondary | ICD-10-CM | POA: Diagnosis not present

## 2023-08-07 DIAGNOSIS — Z23 Encounter for immunization: Secondary | ICD-10-CM

## 2023-08-07 DIAGNOSIS — I10 Essential (primary) hypertension: Secondary | ICD-10-CM | POA: Diagnosis not present

## 2023-08-07 DIAGNOSIS — E6609 Other obesity due to excess calories: Secondary | ICD-10-CM | POA: Diagnosis not present

## 2023-08-07 DIAGNOSIS — R7303 Prediabetes: Secondary | ICD-10-CM | POA: Diagnosis not present

## 2023-08-07 LAB — POCT GLYCOSYLATED HEMOGLOBIN (HGB A1C): Hemoglobin A1C: 6 % — AB (ref 4.0–5.6)

## 2023-08-07 MED ORDER — VALSARTAN-HYDROCHLOROTHIAZIDE 320-25 MG PO TABS: 1.0000 | ORAL_TABLET | Freq: Every day | ORAL | 1 refills | Status: AC

## 2023-08-07 MED ORDER — METFORMIN HCL 500 MG PO TABS: 500.0000 mg | ORAL_TABLET | Freq: Two times a day (BID) | ORAL | 1 refills | Status: AC

## 2023-08-07 NOTE — Progress Notes (Signed)
Established Patient Office Visit  Subjective   Patient ID: Russell Orr, male    DOB: 1971-02-25  Age: 52 y.o. MRN: [id number]  Chief Complaint  Patient presents with   Medical Management of Chronic Issues    Medication fup and refills    HPI Pt is a 52 yo obese male with pre-diabetes and hypertension who presents to the clinic for medication refills.   Pt is moving to [place] and needs refills. No problems or concerns. He has done well on metformin with no problems. He has lost 14lbs. He is active and trying to keep to a healthy diet. Denies any CP, palpitations, headaches or vision changes. He is compliant with medications.   .. Active Ambulatory Problems    Diagnosis Date Noted   Class 3 severe obesity due to excess calories without serious comorbidity with body mass index (BMI) of 40.0 to 44.9 in adult (HCC) 07/27/2010   HYPERTENSION, BENIGN ESSENTIAL 07/12/2007   Low HDL (under 40) 11/22/2012   Hyperlipidemia LDL goal <160 11/23/2012   Obesity (BMI 30-39.9) 08/04/2017   Hypertriglyceridemia 08/04/2017   Family history of premature coronary heart disease 07/07/2021   Plantar fasciitis of right foot 03/04/2022   Anxiety 07/29/2022   Depressed mood 07/29/2022   Pre-diabetes 08/10/2022   Elevated fasting glucose 01/27/2023   Lipoma 01/30/2023   COVID-19 07/28/2023   Resolved Ambulatory Problems    Diagnosis Date Noted   No Resolved Ambulatory Problems   Past Medical History:  Diagnosis Date   Hypertension       Review of Systems  All other systems reviewed and are negative.     Objective:     BP 129/71   Pulse 98   Ht 5\' 7"  (1.702 m)   Wt 244 lb (110.7 kg)   SpO2 99%   BMI 38.22 kg/m  BP Readings from Last 3 Encounters:  08/07/23 129/71  01/27/23 130/79  07/29/22 136/80   Wt Readings from Last 3 Encounters:  08/07/23 244 lb (110.7 kg)  01/27/23 258 lb (117 kg)  07/29/22 259 lb (117.5 kg)    .Marland Kitchen Results for orders placed or performed in  visit on 08/07/23  POCT HgB A1C  Result Value Ref Range   Hemoglobin A1C 6.0 (A) 4.0 - 5.6 %   HbA1c POC (<> result, manual entry)     HbA1c, POC (prediabetic range)     HbA1c, POC (controlled diabetic range)       Physical Exam Constitutional:      Appearance: Normal appearance. He is obese.  HENT:     Head: Normocephalic.  Neck:     Vascular: No carotid bruit.  Cardiovascular:     Rate and Rhythm: Normal rate and regular rhythm.  Pulmonary:     Effort: Pulmonary effort is normal.     Breath sounds: Normal breath sounds.  Musculoskeletal:     Cervical back: Normal range of motion and neck supple. No tenderness.     Right lower leg: No edema.     Left lower leg: No edema.  Lymphadenopathy:     Cervical: No cervical adenopathy.  Neurological:     General: No focal deficit present.     Mental Status: He is alert and oriented to person, place, and time.  Psychiatric:        Mood and Affect: Mood normal.       The 10-year ASCVD risk score (Arnett DK, et al., 2019) is: 4.5%    Assessment & Plan:  Marland KitchenMarland Kitchen  Russell Orr was seen today for medical management of chronic issues.  Diagnoses and all orders for this visit:  Essential hypertension, benign -     valsartan-hydrochlorothiazide (DIOVAN-HCT) 320-25 MG tablet; Take 1 tablet by mouth daily. -     CMP14+EGFR -     Flu vaccine trivalent PF, 6mos and older(Flulaval,Afluria,Fluarix,Fluzone)  Pre-diabetes -     POCT HgB A1C -     metFORMIN (GLUCOPHAGE) 500 MG tablet; Take 1 tablet (500 mg total) by mouth 2 (two) times daily with a meal.  Class 2 obesity due to excess calories without serious comorbidity with body mass index (BMI) of 38.0 to 38.9 in adult -     metFORMIN (GLUCOPHAGE) 500 MG tablet; Take 1 tablet (500 mg total) by mouth 2 (two) times daily with a meal.   A1C to goal and improved with metformin Continue on same medications Continue working with weight loss, down 14lbs BP to goal on Diovan Cmp ordered today Flu  shot given today  Pt will be moving to [place].    Return if symptoms worsen or fail to improve.    Tandy Gaw, PA-C

## 2023-08-08 LAB — CMP14+EGFR
ALT: 24 IU/L (ref 0–44)
AST: 16 IU/L (ref 0–40)
Albumin: 4.3 g/dL (ref 4.1–5.1)
Alkaline Phosphatase: 61 IU/L (ref 44–121)
BUN/Creatinine Ratio: 11 (ref 9–20)
BUN: 10 mg/dL (ref 6–24)
Bilirubin Total: <0.2 mg/dL (ref 0.0–1.2)
CO2: 22 mmol/L (ref 20–29)
Calcium: 9.1 mg/dL (ref 8.7–10.2)
Chloride: 101 mmol/L (ref 96–106)
Creatinine, Ser: 0.89 mg/dL (ref 0.76–1.27)
Globulin, Total: 2.5 g/dL (ref 1.5–4.5)
Glucose: 83 mg/dL (ref 70–99)
Potassium: 4.3 mmol/L (ref 3.5–5.2)
Sodium: 138 mmol/L (ref 134–144)
Total Protein: 6.8 g/dL (ref 6.0–8.5)
eGFR: 104 mL/min/{1.73_m2} (ref >59)

## 2023-08-08 NOTE — Progress Notes (Signed)
[name],   Kidney, liver, glucose look GREAT.

## 2024-07-16 ENCOUNTER — Encounter: Payer: Self-pay | Admitting: Sports Medicine
